# Patient Record
Sex: Female | Born: 1995 | Race: Black or African American | Hispanic: No | Marital: Single | Smoking: Never smoker
Health system: Southern US, Community
[De-identification: ages and names within clinical notes are randomized; demographics above are authoritative.]

## PROBLEM LIST (undated history)

## (undated) ENCOUNTER — Inpatient Hospital Stay (HOSPITAL_COMMUNITY): Payer: Self-pay

## (undated) ENCOUNTER — Inpatient Hospital Stay: Payer: Self-pay

## (undated) ENCOUNTER — Emergency Department (HOSPITAL_COMMUNITY): Disposition: A | Discharge status: Home / Self Care | Payer: Self-pay

## (undated) DIAGNOSIS — Z6841 Body Mass Index (BMI) 40.0 and over, adult: Secondary | ICD-10-CM

## (undated) DIAGNOSIS — O24419 Gestational diabetes mellitus in pregnancy, unspecified control: Secondary | ICD-10-CM

## (undated) DIAGNOSIS — N760 Acute vaginitis: Secondary | ICD-10-CM

## (undated) DIAGNOSIS — A749 Chlamydial infection, unspecified: Secondary | ICD-10-CM

## (undated) DIAGNOSIS — B9689 Other specified bacterial agents as the cause of diseases classified elsewhere: Secondary | ICD-10-CM

## (undated) HISTORY — PX: NO PAST SURGERIES: SHX2092

---
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
## 2014-07-02 ENCOUNTER — Encounter (HOSPITAL_COMMUNITY): Payer: Self-pay | Admitting: *Deleted

## 2015-04-21 ENCOUNTER — Inpatient Hospital Stay (HOSPITAL_COMMUNITY)
Admission: AD | Admit: 2015-04-21 | Discharge: 2015-04-22 | Disposition: A | Discharge status: Home / Self Care | Payer: Medicaid Other | Source: Ambulatory Visit | Attending: Obstetrics and Gynecology | Admitting: Obstetrics and Gynecology

## 2015-04-21 ENCOUNTER — Encounter (HOSPITAL_COMMUNITY): Payer: Self-pay

## 2015-04-21 DIAGNOSIS — Z3A38 38 weeks gestation of pregnancy: Secondary | ICD-10-CM | POA: Diagnosis not present

## 2015-04-21 DIAGNOSIS — N898 Other specified noninflammatory disorders of vagina: Secondary | ICD-10-CM | POA: Diagnosis present

## 2015-04-21 DIAGNOSIS — O322XX0 Maternal care for transverse and oblique lie, not applicable or unspecified: Secondary | ICD-10-CM | POA: Diagnosis not present

## 2015-04-21 LAB — POCT FERN TEST: POCT Fern Test: NEGATIVE

## 2015-04-21 MED ORDER — LACTATED RINGERS IV BOLUS (SEPSIS)
500.0000 mL | Freq: Once | INTRAVENOUS | Status: AC
  Administered 2015-04-22: 1000 mL via INTRAVENOUS

## 2015-04-21 NOTE — MAU Note (Signed)
Pt here with c/o contractions and pressure. Says, "I feel like something is coming out." Denies any problems with the pregnancy. Reports GBS negative. Reports positive fetal movement.

## 2015-04-22 LAB — URINALYSIS, ROUTINE W REFLEX MICROSCOPIC
Bilirubin Urine: NEGATIVE
Glucose, UA: NEGATIVE mg/dL
Hgb urine dipstick: NEGATIVE
Ketones, ur: 15 mg/dL — AB
Nitrite: NEGATIVE
Protein, ur: NEGATIVE mg/dL
Specific Gravity, Urine: 1.025 (ref 1.005–1.030)
Urobilinogen, UA: 1 mg/dL (ref 0.0–1.0)
pH: 6 (ref 5.0–8.0)

## 2015-04-22 LAB — WET PREP, GENITAL
Trich, Wet Prep: NONE SEEN
Yeast Wet Prep HPF POC: NONE SEEN

## 2015-04-22 LAB — URINE MICROSCOPIC-ADD ON

## 2015-04-22 MED ORDER — METRONIDAZOLE 500 MG PO TABS: 500.0000 mg | ORAL_TABLET | Freq: Two times a day (BID) | ORAL | Status: DC

## 2015-04-22 MED ORDER — ACETAMINOPHEN 325 MG PO TABS
650.0000 mg | ORAL_TABLET | Freq: Once | ORAL | Status: AC
  Administered 2015-04-22: 650 mg via ORAL
  Filled 2015-04-22: qty 2

## 2015-04-22 NOTE — Discharge Instructions (Signed)
·   Please keep OB appt with provider. Let them know that your baby is not head down  Treating you for bacterial vaginosis with flagyl for 7 days

## 2015-04-22 NOTE — MAU Note (Signed)
Pt unable to find transportation back home. Taxi called and patient given taxi voucher.

## 2015-04-22 NOTE — MAU Provider Note (Signed)
Patient here as labor check. Called to bedside to evaluate as position unknown and possible ROM. Bedside US showed transverse lie. Negative pooling and negative fern. Vaginal discharge present with odor. Wet prep with signs of BV. Will treat with flagyl.  FHT reassuring and reactive. Toco with contractions q2-7min but not cerival change over 2 hours (CE: 1/thick/-3). Okay to discharge home with return precautions. Not in labor. She follows up with her OB provider on Tuesday.    Caryl Ada, DO 04/22/2015, 1:11 AM PGY-2, [facility]

## 2015-10-28 ENCOUNTER — Encounter (HOSPITAL_BASED_OUTPATIENT_CLINIC_OR_DEPARTMENT_OTHER): Payer: Self-pay | Admitting: *Deleted

## 2015-10-28 ENCOUNTER — Emergency Department (HOSPITAL_BASED_OUTPATIENT_CLINIC_OR_DEPARTMENT_OTHER)
Admission: EM | Admit: 2015-10-28 | Discharge: 2015-10-28 | Disposition: A | Discharge status: Home / Self Care | Payer: Medicaid Other | Attending: Emergency Medicine | Admitting: Emergency Medicine

## 2015-10-28 DIAGNOSIS — Z792 Long term (current) use of antibiotics: Secondary | ICD-10-CM | POA: Insufficient documentation

## 2015-10-28 DIAGNOSIS — Z3202 Encounter for pregnancy test, result negative: Secondary | ICD-10-CM | POA: Insufficient documentation

## 2015-10-28 DIAGNOSIS — B9689 Other specified bacterial agents as the cause of diseases classified elsewhere: Secondary | ICD-10-CM

## 2015-10-28 DIAGNOSIS — N760 Acute vaginitis: Secondary | ICD-10-CM | POA: Diagnosis not present

## 2015-10-28 DIAGNOSIS — R51 Headache: Secondary | ICD-10-CM | POA: Insufficient documentation

## 2015-10-28 DIAGNOSIS — R1030 Lower abdominal pain, unspecified: Secondary | ICD-10-CM | POA: Diagnosis present

## 2015-10-28 LAB — URINALYSIS, ROUTINE W REFLEX MICROSCOPIC
Bilirubin Urine: NEGATIVE
Glucose, UA: NEGATIVE mg/dL
Hgb urine dipstick: NEGATIVE
Ketones, ur: NEGATIVE mg/dL
Nitrite: NEGATIVE
Protein, ur: NEGATIVE mg/dL
Specific Gravity, Urine: 1.027 (ref 1.005–1.030)
pH: 7.5 (ref 5.0–8.0)

## 2015-10-28 LAB — WET PREP, GENITAL
Sperm: NONE SEEN
Trich, Wet Prep: NONE SEEN
Yeast Wet Prep HPF POC: NONE SEEN

## 2015-10-28 LAB — URINE MICROSCOPIC-ADD ON

## 2015-10-28 LAB — PREGNANCY, URINE: Preg Test, Ur: NEGATIVE

## 2015-10-28 MED ORDER — METRONIDAZOLE 500 MG PO TABS: 500.0000 mg | ORAL_TABLET | Freq: Two times a day (BID) | ORAL | Status: DC

## 2015-10-28 NOTE — ED Provider Notes (Signed)
CSN: [id number]     Arrival date & time 10/28/15  4098 History  By signing my name below, I, Bethel Born, attest that this documentation has been prepared under the direction and in the presence of Geoffery Lyons, MD. Electronically Signed: Bethel Born, ED Scribe. 10/28/2015. 8:38 PM   Chief Complaint  Patient presents with  . Abdominal Pain   The history is provided by the patient. No language interpreter was used.   Sheryl Monroe is a 20 y.o. female who presents to the Emergency Department complaining of new, cramping/pressure, 9/10 in severity, lower abdominal pain with onset 1 week ago. She notes 2 episodes of heavy vaginal bleeding this month that lasted for a week each time. Associated symptoms include abnormal vaginal discharge in between episodes of bleeding, increased urgency, headache, and subjective fever last week. She denies dysuria. Pt is sexually active but denies any new partners. She used the Mirena IUD for birth control.    Past Medical History  Diagnosis Date  . Chlamydia   . Bacterial vaginosis    Past Surgical History  Procedure Laterality Date  . No past surgeries     Family History  Problem Relation Age of Onset  . Diabetes Mother    Social History  Substance Use Topics  . Smoking status: Never Smoker   . Smokeless tobacco: None  . Alcohol Use: No   OB History    Gravida Para Term Preterm AB TAB SAB Ectopic Multiple Living   0 0 0 0 0 0 2     Review of Systems  10 Systems reviewed and all are negative for acute change except as noted in the HPI.  Allergies  Review of patient's allergies indicates no known allergies.  Home Medications   Prior to Admission medications   Medication Sig Start Date End Date Taking? Authorizing Provider  levonorgestrel (MIRENA, 52 MG,) 20 MCG/24HR IUD 1 each by Intrauterine route once.   Yes Historical Provider, MD  metroNIDAZOLE (FLAGYL) 500 MG tablet Take 1 tablet (500 mg total) by mouth 2 (two) times  daily. 04/22/15   Pincus Large, DO   BP 148/101 mmHg  Pulse 93  Temp(Src) 98.3 F (36.8 C) (Oral)  Resp 20  Ht  (1.651 m)  Wt 240 lb (108.863 kg)  BMI 39.94 kg/m2  SpO2 100%  LMP  (LMP Unknown)  Breastfeeding? Unknown Physical Exam  Constitutional: She is oriented to person, place, and time. She appears well-developed and well-nourished. No distress.  HENT:  Head: Normocephalic and atraumatic.  Eyes: EOM are normal.  Neck: Normal range of motion.  Cardiovascular: Normal rate, regular rhythm and normal heart sounds.   Pulmonary/Chest: Effort normal and breath sounds normal.  Abdominal: Soft. She exhibits no distension. There is tenderness.  Mild suprapubic tenderness  Musculoskeletal: Normal range of motion.  Neurological: She is alert and oriented to person, place, and time.  Skin: Skin is warm and dry.  Psychiatric: She has a normal mood and affect. Judgment normal.  Nursing note and vitals reviewed.   ED Course  Procedures (including critical care time) DIAGNOSTIC STUDIES: Oxygen Saturation is 100% on RA,  normal by my interpretation.    COORDINATION OF CARE: 8:36 PM Discussed treatment plan which includes lab work and pelvic exam with pt at bedside and pt agreed to plan.  Labs Review Labs Reviewed  URINALYSIS, ROUTINE W REFLEX MICROSCOPIC (NOT AT [facility]) - Abnormal; Notable for the following:    APPearance CLOUDY (*)  Leukocytes, UA LARGE (*)    All other components within normal limits  URINE MICROSCOPIC-ADD ON - Abnormal; Notable for the following:    Squamous Epithelial / LPF 6-30 (*)    Bacteria, UA FEW (*)    All other components within normal limits  PREGNANCY, URINE    Imaging Review No results found. I have personally reviewed and evaluated these lab results as part of my medical decision-making.    MDM   Final diagnoses:  None    Workup reveals leukocytes and her UA, however no other findings consistent with UTI. Her wet prep reveals  clue cells and moderate WBCs. She will be treated with Flagyl for BV awaiting GC and Chlamydia testing.  I personally performed the services described in this documentation, which was scribed in my presence. The recorded information has been reviewed and is accurate.       Geoffery Lyons, MD 10/28/15 2118

## 2015-10-28 NOTE — Discharge Instructions (Signed)
Flagyl as prescribed. ° °We will call you if your cultures indicate you require further treatment. ° ° °Bacterial Vaginosis °Bacterial vaginosis is a vaginal infection that occurs when the normal balance of bacteria in the vagina is disrupted. It results from an overgrowth of certain bacteria. This is the most common vaginal infection in women of childbearing age. Treatment is important to prevent complications, especially in pregnant women, as it can cause a premature delivery. °CAUSES  °Bacterial vaginosis is caused by an increase in harmful bacteria that are normally present in smaller amounts in the vagina. Several different kinds of bacteria can cause bacterial vaginosis. However, the reason that the condition develops is not fully understood. °RISK FACTORS °Certain activities or behaviors can put you at an increased risk of developing bacterial vaginosis, including: °· Having a new sex partner or multiple sex partners. °· Douching. °· Using an intrauterine device (IUD) for contraception. °Women do not get bacterial vaginosis from toilet seats, bedding, swimming pools, or contact with objects around them. °SIGNS AND SYMPTOMS  °Some women with bacterial vaginosis have no signs or symptoms. Common symptoms include: °· Grey vaginal discharge. °· A fishlike odor with discharge, especially after sexual intercourse. °· Itching or burning of the vagina and vulva. °· Burning or pain with urination. °DIAGNOSIS  °Your health care provider will take a medical history and examine the vagina for signs of bacterial vaginosis. A sample of vaginal fluid may be taken. Your health care provider will look at this sample under a microscope to check for bacteria and abnormal cells. A vaginal pH test may also be done.  °TREATMENT  °Bacterial vaginosis may be treated with antibiotic medicines. These may be given in the form of a pill or a vaginal cream. A second round of antibiotics may be prescribed if the condition comes back after  treatment. Because bacterial vaginosis increases your risk for sexually transmitted diseases, getting treated can help reduce your risk for chlamydia, gonorrhea, HIV, and herpes. °HOME CARE INSTRUCTIONS  °· Only take over-the-counter or prescription medicines as directed by your health care provider. °· If antibiotic medicine was prescribed, take it as directed. Make sure you finish it even if you start to feel better. °· Tell all sexual partners that you have a vaginal infection. They should see their health care provider and be treated if they have problems, such as a mild rash or itching. °· During treatment, it is important that you follow these instructions: °¨ Avoid sexual activity or use condoms correctly. °¨ Do not douche. °¨ Avoid alcohol as directed by your health care provider. °¨ Avoid breastfeeding as directed by your health care provider. °SEEK MEDICAL CARE IF:  °· Your symptoms are not improving after 3 days of treatment. °· You have increased discharge or pain. °· You have a fever. °MAKE SURE YOU:  °· Understand these instructions. °· Will watch your condition. °· Will get help right away if you are not doing well or get worse. °FOR MORE INFORMATION  °Centers for Disease Control and Prevention, Division of STD Prevention: www.cdc.gov/std °American Sexual Health Association (ASHA): www.ashastd.org  °  °This information is not intended to replace advice given to you by your health care provider. Make sure you discuss any questions you have with your health care provider. °  °Document Released: 08/17/2005 Document Revised: 09/07/2014 Document Reviewed: 03/29/2013 °Elsevier Interactive Patient Education ©2016 [employer]vier Inc. ° °

## 2015-10-28 NOTE — ED Notes (Signed)
Lower abdominal pain on and off x 1 week. Started after her menses ended.

## 2015-10-30 LAB — GC/CHLAMYDIA PROBE AMP (~~LOC~~) NOT AT ARMC
Chlamydia: POSITIVE — AB
Neisseria Gonorrhea: NEGATIVE

## 2015-10-31 ENCOUNTER — Telehealth (HOSPITAL_COMMUNITY): Payer: Self-pay

## 2015-10-31 NOTE — Telephone Encounter (Signed)
Results received from [facility].  No antibiotic treatment or prescription given for STD.  Chart to MD office for review.  [facility] form attached. 

## 2015-11-01 ENCOUNTER — Telehealth (HOSPITAL_COMMUNITY): Payer: Self-pay

## 2015-11-01 NOTE — Telephone Encounter (Signed)
Chart returned from EDP. Reviewed by Allyne GeeSanders PA . Pt needs Azithromycin 1000mg  po x 1. Attempting to contact. Unable to reach by telephone. Letter sent to address on record.

## 2015-11-17 ENCOUNTER — Telehealth (HOSPITAL_BASED_OUTPATIENT_CLINIC_OR_DEPARTMENT_OTHER): Payer: Self-pay | Admitting: Emergency Medicine

## 2015-11-17 NOTE — Telephone Encounter (Signed)
Lost to followup, letter returned, no forwarding address

## 2017-01-11 ENCOUNTER — Emergency Department (HOSPITAL_BASED_OUTPATIENT_CLINIC_OR_DEPARTMENT_OTHER)
Admission: EM | Admit: 2017-01-11 | Discharge: 2017-01-11 | Disposition: A | Discharge status: Home / Self Care | Payer: 59 | Attending: Emergency Medicine | Admitting: Emergency Medicine

## 2017-01-11 ENCOUNTER — Encounter (HOSPITAL_BASED_OUTPATIENT_CLINIC_OR_DEPARTMENT_OTHER): Payer: Self-pay | Admitting: Emergency Medicine

## 2017-01-11 ENCOUNTER — Emergency Department (HOSPITAL_BASED_OUTPATIENT_CLINIC_OR_DEPARTMENT_OTHER): Payer: 59

## 2017-01-11 DIAGNOSIS — S46912A Strain of unspecified muscle, fascia and tendon at shoulder and upper arm level, left arm, initial encounter: Secondary | ICD-10-CM | POA: Diagnosis not present

## 2017-01-11 DIAGNOSIS — Y929 Unspecified place or not applicable: Secondary | ICD-10-CM | POA: Diagnosis not present

## 2017-01-11 DIAGNOSIS — Y9389 Activity, other specified: Secondary | ICD-10-CM | POA: Diagnosis not present

## 2017-01-11 DIAGNOSIS — Y990 Civilian activity done for income or pay: Secondary | ICD-10-CM | POA: Diagnosis not present

## 2017-01-11 DIAGNOSIS — S4992XA Unspecified injury of left shoulder and upper arm, initial encounter: Secondary | ICD-10-CM | POA: Diagnosis present

## 2017-01-11 DIAGNOSIS — X58XXXA Exposure to other specified factors, initial encounter: Secondary | ICD-10-CM | POA: Diagnosis not present

## 2017-01-11 LAB — PREGNANCY, URINE: Preg Test, Ur: NEGATIVE

## 2017-01-11 MED ORDER — NAPROXEN 500 MG PO TABS: 500.0000 mg | ORAL_TABLET | Freq: Two times a day (BID) | ORAL | 0 refills | Status: DC

## 2017-01-11 MED ORDER — TRAMADOL HCL 50 MG PO TABS
50.0000 mg | ORAL_TABLET | Freq: Four times a day (QID) | ORAL | 0 refills | Status: DC | PRN
Start: 2017-01-11 — End: 2018-08-11

## 2017-01-11 NOTE — ED Provider Notes (Signed)
[facility]   CSN: [id number] Arrival date & time: 01/11/17  [id number]     History   Chief Complaint Chief Complaint  Patient presents with  . Shoulder Pain    HPI Sheryl Monroe is a 21 y.o. female.  Patient is a 21 year old female with no significant past medical history. She presents for evaluation of left shoulder pain. 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 She felt a pull in her shoulder and it has been hurting since. It starts in the back of her shoulder and radiates down her hand. She reports numbness in her left fingertips. Her pain is worse with movement and relieved with rest. She has been wearing a sling the block to her mother from a prior injury that her mother had.   The history is provided by the patient.  Shoulder Pain   This is a new problem. The current episode started 2 days ago. The problem occurs constantly. The problem has not changed since onset.The pain is present in the left shoulder. The pain is moderate. Associated symptoms include numbness. The symptoms are aggravated by activity. She has tried OTC pain medications for the symptoms. The treatment provided no relief.    Past Medical History:  Diagnosis Date  . Bacterial vaginosis   . Chlamydia     Patient Active Problem List   Diagnosis Date Noted  . Social isolation 09/01/2013  . Active labor 08/31/2013    Past Surgical History:  Procedure Laterality Date  . NO PAST SURGERIES      OB History    Gravida Para Term Preterm AB Living   3 2 2  0 0 2   SAB TAB Ectopic Multiple Live Births   0 0 0 0 2       Home Medications    Prior to Admission medications   Medication Sig Start Date End Date Taking? Authorizing Provider  levonorgestrel (MIRENA, 52 MG,) 20 MCG/24HR IUD 1 each by Intrauterine route once.    [provider]  metroNIDAZOLE (FLAGYL) 500 MG tablet Take 1 tablet (500 mg total) by mouth 2 (two) times  daily. One po bid x 7 days 10/28/15   Geoffery Lyonselo, Eulas Schweitzer, MD    Family History Family History  Problem Relation Age of Onset  . Diabetes Mother     Social History Social History  Substance Use Topics  . Smoking status: Never Smoker  . Smokeless tobacco: Never Used  . Alcohol use No     Allergies   Patient has no known allergies.   Review of Systems Review of Systems  Neurological: Positive for numbness.  All other systems reviewed and are negative.    Physical Exam Updated Vital Signs BP (!) 146/97 (BP Location: Right Arm)   Pulse 89   Temp 98.7 F (37.1 C) (Oral)   Resp 18   Ht 5\' 5"  (1.651 m)   Wt 250 lb (113.4 kg)   SpO2 100%   BMI 41.60 kg/m   Physical Exam  Constitutional: She is oriented to person, place, and time. She appears well-developed and well-nourished. No distress.  HENT:  Head: Normocephalic and atraumatic.  Neck: Normal range of motion. Neck supple.  Musculoskeletal:  The left shoulder appears grossly normal. There is tenderness to palpation in the soft tissues of the posterior aspect of the shoulder and overlying the scapula. The left hand appears grossly normal. Ulnar and radial pulses are easily palpable.  Strength is 5 out of 5 throughout the entire hand and she is able to flex and extend all fingers. She does report some numbness to sensation in the lateral aspect of her left finger.  Neurological: She is alert and oriented to person, place, and time.  Skin: Skin is warm and dry. She is not diaphoretic.  Nursing note and vitals reviewed.    ED Treatments / Results  Labs (all labs ordered are listed, but only abnormal results are displayed) Labs Reviewed  PREGNANCY, URINE    EKG  EKG Interpretation None       Radiology No results found.  Procedures Procedures (including critical care time)  Medications Ordered in ED Medications - No data to display   Initial Impression / Assessment and Plan / ED Course  I have reviewed the  triage vital signs and the nursing notes.  Pertinent labs & imaging results that were available during my care of the patient were reviewed by me and considered in my medical decision making (see chart for details).  X-rays are negative. This appears to be a rotator cuff strain. This will be treated with anti-inflammatories continued use of her sling, and follow-up in the next week if not improving to discuss imaging versus physical therapy.  Final Clinical Impressions(s) / ED Diagnoses   Final diagnoses:  None    New Prescriptions New Prescriptions   No medications on file     Geoffery Lyons, MD 01/11/17 1013

## 2017-01-11 NOTE — Discharge Instructions (Signed)
Naproxen as prescribed.  Tramadol as prescribed as needed for pain not relieved with naproxen.  Continue to wear your shoulder sling for comfort.  Follow-up with your primary Dr. if you're not improving in the next week to discuss further imaging or physical therapy.

## 2017-01-11 NOTE — ED Triage Notes (Signed)
Patient states that she has pain to her left side, back and shoulder down into her left hand. The patient has a sling on

## 2018-05-10 ENCOUNTER — Emergency Department: Payer: Self-pay

## 2018-05-10 ENCOUNTER — Encounter: Payer: Self-pay | Admitting: Emergency Medicine

## 2018-05-10 DIAGNOSIS — R05 Cough: Secondary | ICD-10-CM | POA: Insufficient documentation

## 2018-05-10 DIAGNOSIS — Z5321 Procedure and treatment not carried out due to patient leaving prior to being seen by health care provider: Secondary | ICD-10-CM | POA: Insufficient documentation

## 2018-05-10 NOTE — ED Triage Notes (Signed)
Pt c/o cough, nasal and chest congestion as well as generalized fatigue x4 days.

## 2018-05-11 ENCOUNTER — Emergency Department
Admission: EM | Admit: 2018-05-11 | Discharge: 2018-05-11 | Discharge status: Against Medical Advice | Payer: Self-pay | Attending: Emergency Medicine | Admitting: Emergency Medicine

## 2018-08-11 ENCOUNTER — Observation Stay
Admission: EM | Admit: 2018-08-11 | Discharge: 2018-08-11 | Disposition: A | Discharge status: Home / Self Care | Payer: Medicaid Other | Attending: Obstetrics and Gynecology | Admitting: Obstetrics and Gynecology

## 2018-08-11 ENCOUNTER — Other Ambulatory Visit: Payer: Self-pay

## 2018-08-11 ENCOUNTER — Observation Stay: Payer: Medicaid Other

## 2018-08-11 DIAGNOSIS — O99013 Anemia complicating pregnancy, third trimester: Secondary | ICD-10-CM | POA: Diagnosis not present

## 2018-08-11 DIAGNOSIS — O4693 Antepartum hemorrhage, unspecified, third trimester: Secondary | ICD-10-CM | POA: Diagnosis not present

## 2018-08-11 DIAGNOSIS — O4692 Antepartum hemorrhage, unspecified, second trimester: Secondary | ICD-10-CM | POA: Diagnosis present

## 2018-08-11 DIAGNOSIS — Z3A34 34 weeks gestation of pregnancy: Secondary | ICD-10-CM | POA: Diagnosis not present

## 2018-08-11 LAB — WET PREP, GENITAL
Clue Cells Wet Prep HPF POC: NONE SEEN
Sperm: NONE SEEN
Trich, Wet Prep: NONE SEEN
Yeast Wet Prep HPF POC: NONE SEEN

## 2018-08-11 LAB — KLEIHAUER-BETKE STAIN
Fetal Cells %: 5 %
Quantitation Fetal Hemoglobin: 0.0023 mL

## 2018-08-11 LAB — CBC
HCT: 28.8 % — ABNORMAL LOW (ref 36.0–46.0)
Hemoglobin: 8.7 g/dL — ABNORMAL LOW (ref 12.0–15.0)
MCH: 25.6 pg — ABNORMAL LOW (ref 26.0–34.0)
MCHC: 30.2 g/dL (ref 30.0–36.0)
MCV: 84.7 fL (ref 80.0–100.0)
Platelets: 283 10*3/uL (ref 150–400)
RBC: 3.4 MIL/uL — ABNORMAL LOW (ref 3.87–5.11)
RDW: 17.5 % — ABNORMAL HIGH (ref 11.5–15.5)
WBC: 7.6 10*3/uL (ref 4.0–10.5)
nRBC: 0 % (ref 0.0–0.2)

## 2018-08-11 LAB — CHLAMYDIA/NGC RT PCR (ARMC ONLY)
Chlamydia Tr: NOT DETECTED
N gonorrhoeae: NOT DETECTED

## 2018-08-11 LAB — TYPE AND SCREEN
ABO/RH(D): O POS
Antibody Screen: NEGATIVE

## 2018-08-11 LAB — OB RESULTS CONSOLE GC/CHLAMYDIA
Chlamydia: NEGATIVE
Gonorrhea: NEGATIVE

## 2018-08-11 MED ORDER — ACETAMINOPHEN 325 MG PO TABS
650.0000 mg | ORAL_TABLET | ORAL | Status: DC | PRN
  Administered 2018-08-11: 650 mg via ORAL
  Filled 2018-08-11: qty 2

## 2018-08-11 NOTE — OB Triage Note (Addendum)
Pt G5P4 presents today with complaints of bleeding and abdominal pain. [facility] to the [facility]. Pt reports she is [redacted] weeks pregnant based off of her LMP however states she 'still has her period every month that last for several days". Her bleeding last started Sunday and ended Tuesday. No bleeding since. Abdominal pain comes and goes for the last month and she rates it a 7/10 and describes it as cramping and pressure. Pt also reports having recent headaches along with N/V. Pt reports +FM and reports no vaginal discharge. Monitors applied. VSS.

## 2018-08-11 NOTE — Discharge Summary (Signed)
Physician Discharge Summary  Patient ID: Sheryl Monroe MRN: [id number] DOB/AGE: 10/02/1995 22 y.o.  Admit date: 08/11/2018 Discharge date: 08/11/2018  Admission Diagnoses: Second trimester vaginal bleeding  Discharge Diagnoses:  Active Problems:   Antepartum bleeding, second trimester   Discharged Condition: stable  Hospital Course: 22 year old G5P4004 at unknown gestational age, but roughly late second early third trimester presenting from health department for evaluation of continued monthly bleeding throughout pregnancy.  On admission fetal heart tones were easily traced with a reactive strip noted.  No current vaginal bleeding.  Ultrasound was obtained to rule out potential for placenta previa and showed the placenta to be well away from the cervical os with a cervical length of 4.4cm and biometrics consistent with [redacted]w[redacted]d giving an EDD of 09/20/18.  CBC did reveal anemia.  Kleihauer-bethke to rule out abruption pending.  GC/CT cultures were ordered but the patient left AMA prior to full evaluation secondary to child care issues.  Consults: None  Significant Diagnostic Studies:  Results for orders placed or performed during the hospital encounter of 08/11/18 (from the past 24 hour(s))  CBC     Status: Abnormal   Collection Time: 08/11/18  2:26 PM  Result Value Ref Range   WBC 7.6 4.0 - 10.5 K/uL   RBC 3.40 (L) 3.87 - 5.11 MIL/uL   Hemoglobin 8.7 (L) 12.0 - 15.0 g/dL   HCT 78.2 (L) 95.6 - 21.3 %   MCV 84.7 80.0 - 100.0 fL   MCH 25.6 (L) 26.0 - 34.0 pg   MCHC 30.2 30.0 - 36.0 g/dL   RDW 08.6 (H) 57.8 - 46.9 %   Platelets 283 150 - 400 K/uL   nRBC 0.0 0.0 - 0.2 %  Type and screen     Status: None   Collection Time: 08/11/18  2:26 PM  Result Value Ref Range   ABO/RH(D) O POS    Antibody Screen NEG    Sample Expiration      08/14/2018 Performed at [facility], [address] [place], [place] [address]   Wet prep, genital     Status: Abnormal   Collection Time:  08/11/18  4:47 PM  Result Value Ref Range   Yeast Wet Prep HPF POC NONE SEEN NONE SEEN   Trich, Wet Prep NONE SEEN NONE SEEN   Clue Cells Wet Prep HPF POC NONE SEEN NONE SEEN   WBC, Wet Prep HPF POC FEW (A) NONE SEEN   Sperm NONE SEEN    US Ob Comp + 14 Wk  Result Date: 08/11/2018 CLINICAL DATA:  Second trimester bleeding. EXAM: OBSTETRICAL ULTRASOUND >14 WKS FINDINGS: Number of Fetuses: 1 Heart Rate:  175 bpm Movement: Yes Presentation: Cephalic Previa: No Placental Location: Posterior fundal Amniotic Fluid (Subjective): Normal Amniotic Fluid (Objective): AFI = 12 cm (5%ile= 8.1 cm, 95%= 24.8 cm for 34 wks) FETAL BIOMETRY BPD: 8.26cm 33w 2d HC:   30.8cm 34w 3d AC:   31.2cm 35w 1d FL:   6.76cm 34w 5d Current Mean GA: 34w 2d [place] EDC: 09/20/2018 Estimated Fetal Weight:  2509g    59%ile FETAL ANATOMY Lateral Ventricles: Appears normal Thalami/CSP: Appears normal Posterior Fossa:  Not visualized Nuchal Region: Not visualized   NFT= N/A > 20 WKS Upper Lip: Appears normal Spine: Limited views appear normal 4 Chamber Heart on Left: Limited views appear normal LVOT: Appears normal RVOT: Not visualized Stomach on Left: Appears normal 3 Vessel Cord: Appears normal Cord Insertion site: Not visualized Kidneys: Appears normal Bladder: Appears  normal Extremities: Appears normal Sex: Female Technically difficult due to: Limited examination secondary to body habitus and advanced gestational age. Maternal Findings: Cervix:  Normal cervical length measuring 4.4 cm.  Cervix is closed. IMPRESSION: 1. Single live intrauterine pregnancy as detailed above. Electronically Signed   By: Elige KoHetal  Patel   On: 08/11/2018 16:37     Discharge Exam: Blood pressure 133/75, pulse (!) 107, temperature 97.9 F (36.6 C), temperature source Oral, resp. rate 20, height 5\' 5"  (1.651 m), weight 113.4 kg, unknown if currently breastfeeding.   Disposition: Discharge disposition: 01-Home or Self Care       Discharge Instructions     Discharge activity:  No Restrictions   Complete by:  As directed    Discharge diet:  No restrictions   Complete by:  As directed    Fetal Kick Count:  Lie on our left side for one hour after a meal, and count the number of times your baby kicks.  If it is less than 5 times, get up, move around and drink some juice.  Repeat the test 30 minutes later.  If it is still less than 5 kicks in an hour, notify your doctor.   Complete by:  As directed    LABOR:  When conractions begin, you should start to time them from the beginning of one contraction to the beginning  of the next.  When contractions are 5 - 10 minutes apart or less and have been regular for at least an hour, you should call your health care provider.   Complete by:  As directed    No sexual activity restrictions   Complete by:  As directed    Notify physician for bleeding from the vagina   Complete by:  As directed    Notify physician for blurring of vision or spots before the eyes   Complete by:  As directed    Notify physician for chills or fever   Complete by:  As directed    Notify physician for fainting spells, "black outs" or loss of consciousness   Complete by:  As directed    Notify physician for increase in vaginal discharge   Complete by:  As directed    Notify physician for leaking of fluid   Complete by:  As directed    Notify physician for pain or burning when urinating   Complete by:  As directed    Notify physician for pelvic pressure (sudden increase)   Complete by:  As directed    Notify physician for severe or continued nausea or vomiting   Complete by:  As directed    Notify physician for sudden gushing of fluid from the vagina (with or without continued leaking)   Complete by:  As directed    Notify physician for sudden, constant, or occasional abdominal pain   Complete by:  As directed    Notify physician if baby moving less than usual   Complete by:  As directed      Allergies as of 08/11/2018   No  Known Allergies     Medication List    STOP taking these medications   metroNIDAZOLE 500 MG tablet Commonly known as:  FLAGYL   MIRENA (52 MG) 20 MCG/24HR IUD Generic drug:  levonorgestrel   naproxen 500 MG tablet Commonly known as:  NAPROSYN   traMADol 50 MG tablet Commonly known as:  ULTRAM        Signed: Vena Austriandreas Shernell Saldierna 08/11/2018, 5:49 PM

## 2018-08-11 NOTE — Progress Notes (Signed)
Pt notified this RN at 1630 that she must leave hospital by 1700 to pick up her kids. RN notified MD and states he needs to do a wet prep and a G/c urine needs to be sent down so if pt needs to leave it will have to be AMA. RN discussed with pt and pt understands but she must pick up her kids so she states she will leave AMA. [facility] if pt can do swabs and MD agrees. swabs sent to lab prior to pt leaving. RN and [facility] educated pt on leaving AMA. Pt verbalizes understanding.

## 2018-08-15 ENCOUNTER — Encounter: Payer: Self-pay | Admitting: Obstetrics & Gynecology

## 2018-09-01 ENCOUNTER — Other Ambulatory Visit: Payer: Self-pay | Admitting: Physician Assistant

## 2018-09-01 DIAGNOSIS — Z3483 Encounter for supervision of other normal pregnancy, third trimester: Secondary | ICD-10-CM

## 2018-09-01 DIAGNOSIS — Z3689 Encounter for other specified antenatal screening: Secondary | ICD-10-CM

## 2018-09-02 ENCOUNTER — Other Ambulatory Visit: Payer: Self-pay | Admitting: Physician Assistant

## 2018-09-02 ENCOUNTER — Ambulatory Visit
Admission: RE | Admit: 2018-09-02 | Discharge: 2018-09-02 | Disposition: A | Discharge status: Home / Self Care | Payer: Medicaid Other | Source: Ambulatory Visit | Attending: Physician Assistant | Admitting: Physician Assistant

## 2018-09-02 DIAGNOSIS — Z3689 Encounter for other specified antenatal screening: Secondary | ICD-10-CM | POA: Insufficient documentation

## 2018-09-02 DIAGNOSIS — Z3483 Encounter for supervision of other normal pregnancy, third trimester: Secondary | ICD-10-CM | POA: Diagnosis present

## 2018-09-02 LAB — OB RESULTS CONSOLE HEPATITIS B SURFACE ANTIGEN: Hepatitis B Surface Ag: NEGATIVE

## 2018-09-02 LAB — OB RESULTS CONSOLE RPR: RPR: NONREACTIVE

## 2018-09-03 LAB — OB RESULTS CONSOLE GBS: GBS: POSITIVE

## 2018-09-05 ENCOUNTER — Encounter
Admission: RE | Admit: 2018-09-05 | Discharge: 2018-09-05 | Disposition: A | Discharge status: Home / Self Care | Payer: Self-pay | Source: Ambulatory Visit | Attending: Anesthesiology | Admitting: Anesthesiology

## 2018-09-05 NOTE — Consult Note (Signed)
[facility] Anesthesia Consultation  JALEE MCCLOY BTD:[id number] DOB: [birth year] DOA: 09/05/2018 PCP: Patient, No Pcp Per   Requesting physician: [facility] Date of consultation: 09/05/18 Reason for consultation: Obesity during pregnancy  CHIEF COMPLAINT:  Obesity during pregnancy  HISTORY OF PRESENT ILLNESS: Sheryl Monroe  is a 23 y.o. female with a known history of obesity during pregnancy, late entry to prenatal care. Denies hx of cardiac disease, denies asthma. Denies personal or family hx of bleeding disorder. Has had 4 prior vaginal deliveries, has never had an epidural, used systemic pain medication for pain management for last deliveries, does not desire epidural for labor pain management.    PAST MEDICAL HISTORY:   Past Medical History:  Diagnosis Date  . Bacterial vaginosis   . Chlamydia     PAST SURGICAL HISTORY:  Past Surgical History:  Procedure Laterality Date  . NO PAST SURGERIES      SOCIAL HISTORY:  Social History   Tobacco Use  . Smoking status: Never Smoker  . Smokeless tobacco: Never Used  Substance Use Topics  . Alcohol use: No    FAMILY HISTORY:  Family History  Problem Relation Age of Onset  . Diabetes Mother     DRUG ALLERGIES: No Known Allergies  REVIEW OF SYSTEMS:   RESPIRATORY: No cough, shortness of breath, wheezing.  CARDIOVASCULAR: No chest pain, orthopnea, edema.  HEMATOLOGY: No anemia, easy bruising or bleeding SKIN: No rash or lesion. NEUROLOGIC: No tingling, numbness, weakness.  PSYCHIATRY: No anxiety or depression.   MEDICATIONS AT HOME:  Prior to Admission medications   Not on File      PHYSICAL EXAMINATION:   VITAL SIGNS: Blood pressure (!) 144/86, pulse 100, temperature 36.9 C, temperature source Oral, height 5\' 5"  (1.651 m), weight (!) 151.9 kg, SpO2 99 %, unknown if currently breastfeeding.  GENERAL:  23 y.o.-year-old patient no acute distress.  HEENT: Head  atraumatic, normocephalic. Oropharynx and nasopharynx clear. MP 2, TM distance >3 cm, normal mouth opening, poor dentition. LUNGS: Normal breath sounds bilaterally, no wheezing, rales,rhonchi. No use of accessory muscles of respiration.  CARDIOVASCULAR: S1, S2 normal. No murmurs, rubs, or gallops.  EXTREMITIES: No pedal edema, cyanosis, or clubbing.  NEUROLOGIC: normal gait PSYCHIATRIC: The patient is alert and oriented x 3.  SKIN: No obvious rash, lesion, or ulcer.    IMPRESSION AND PLAN:   [name]  is a 23 y.o. female presenting with obesity during pregnancy. BMI is currently 45 at [redacted] weeks gestation. Planning for vaginal delivery.  Pt required prompting for good airway exam, complaining of tooth pain on R side, after prompting airway exam is reassuring. Excess back adiposity but midline is easily identified.  Discussed spinal vs GA if cesarean delivery is required. Discussed increased risk of difficult intubation during pregnancy should an emergency cesarean delivery be required.   Pt had induction with her last 2 deliveries, she is unsure if they are planning to induce her this pregnancy.  Plan for delivery at [facility].

## 2018-09-06 ENCOUNTER — Observation Stay
Admission: EM | Admit: 2018-09-06 | Discharge: 2018-09-07 | Disposition: A | Discharge status: Home / Self Care | Payer: Medicaid Other | Attending: Obstetrics and Gynecology | Admitting: Obstetrics and Gynecology

## 2018-09-06 DIAGNOSIS — N760 Acute vaginitis: Secondary | ICD-10-CM | POA: Insufficient documentation

## 2018-09-06 DIAGNOSIS — R51 Headache: Secondary | ICD-10-CM | POA: Insufficient documentation

## 2018-09-06 DIAGNOSIS — O26893 Other specified pregnancy related conditions, third trimester: Principal | ICD-10-CM | POA: Insufficient documentation

## 2018-09-06 DIAGNOSIS — R109 Unspecified abdominal pain: Secondary | ICD-10-CM | POA: Insufficient documentation

## 2018-09-06 DIAGNOSIS — Z3A38 38 weeks gestation of pregnancy: Secondary | ICD-10-CM

## 2018-09-06 NOTE — OB Triage Note (Signed)
Recvd pt from EMS. Pt c/o contractions that started at 2200 and started feeling short of breath from how baby is laying. States no vaginal bleeding and felt some watery discharge. Pain is a 7 out of 10.

## 2018-09-07 ENCOUNTER — Inpatient Hospital Stay: Admission: RE | Admit: 2018-09-07 | Payer: Self-pay | Source: Ambulatory Visit

## 2018-09-07 ENCOUNTER — Telehealth: Payer: Self-pay | Admitting: Obstetrics & Gynecology

## 2018-09-07 ENCOUNTER — Encounter: Payer: Self-pay | Admitting: *Deleted

## 2018-09-07 DIAGNOSIS — N760 Acute vaginitis: Secondary | ICD-10-CM | POA: Diagnosis not present

## 2018-09-07 DIAGNOSIS — N898 Other specified noninflammatory disorders of vagina: Secondary | ICD-10-CM | POA: Diagnosis present

## 2018-09-07 DIAGNOSIS — R109 Unspecified abdominal pain: Secondary | ICD-10-CM | POA: Diagnosis present

## 2018-09-07 DIAGNOSIS — O26893 Other specified pregnancy related conditions, third trimester: Secondary | ICD-10-CM | POA: Diagnosis present

## 2018-09-07 DIAGNOSIS — R51 Headache: Secondary | ICD-10-CM | POA: Diagnosis not present

## 2018-09-07 DIAGNOSIS — Z3A38 38 weeks gestation of pregnancy: Secondary | ICD-10-CM | POA: Diagnosis not present

## 2018-09-07 DIAGNOSIS — R1013 Epigastric pain: Secondary | ICD-10-CM

## 2018-09-07 LAB — RUPTURE OF MEMBRANE (ROM)PLUS: Rom Plus: NEGATIVE

## 2018-09-07 LAB — WET PREP, GENITAL
Clue Cells Wet Prep HPF POC: NONE SEEN
Sperm: NONE SEEN
Trich, Wet Prep: NONE SEEN
Yeast Wet Prep HPF POC: NONE SEEN

## 2018-09-07 MED ORDER — METRONIDAZOLE 500 MG PO TABS: 500.0000 mg | ORAL_TABLET | Freq: Two times a day (BID) | ORAL | 0 refills | Status: AC

## 2018-09-07 MED ORDER — BUTALBITAL-APAP-CAFFEINE 50-325-40 MG PO TABS
1.0000 | ORAL_TABLET | Freq: Once | ORAL | Status: AC
  Administered 2018-09-07: 1 via ORAL
  Filled 2018-09-07: qty 1

## 2018-09-07 NOTE — Telephone Encounter (Signed)
Patient was seen in ER Yesterday . Patient is calling for Follow up and refuses to see CS when I tried to schedule her for tomorrow. I schedule patient with you on 09/09/17 at 11:20.

## 2018-09-07 NOTE — Final Progress Note (Signed)
Triage Note  Sheryl Monroe is an 23 y.o. female.  HPI: Patient presents to labor and delivery today brought by EMS. She reports that she has a severe pain in her mid upper gastric area that caused her to vomit. She then had a large gush of fluid which may of been discharge or urine. She called EMS. She has been in a lot of pain. She feels like this pregnancy is bigger than her previous pregnancies. Her largest infant was 8lbs 3oz. She reports that she ahs difficulty  She says that her complications this pregnancy have been bleeding like a period throughout her pregnancy and a short pregnancy interval, and obesity. She reports that she has a headache today and that tylenol does not usually help her headache. She denies gestational diabetes, hypertension, and preeclampsia.   OB History  Gravida Para Term Preterm AB Living  5 4 4  0 0 4  SAB TAB Ectopic Multiple Live Births  0 0 0 0 4    # Outcome Date GA Lbr Len/2nd Weight Sex Delivery Anes PTL Lv  5 Current           4 Term 08/31/13 [redacted]w[redacted]d  3255 g M Vag-Spont None  LIV  3 Term 03/24/12 [redacted]w[redacted]d  3545 g M Vag-Spont None  LIV  2 Term           1 Term             Past Medical History:  Diagnosis Date  . Bacterial vaginosis   . Chlamydia     Past Surgical History:  Procedure Laterality Date  . NO PAST SURGERIES      Family History  Problem Relation Age of Onset  . Diabetes Mother     Social History:  reports that she has never smoked. She has never used smokeless tobacco. She reports that she does not drink alcohol or use drugs.  Allergies: No Known Allergies  Medications: I have reviewed the patient's current medications.  No results found for this or any previous visit (from the past 48 hour(s)).  No results found.  Review of Systems  Constitutional: Negative for chills, fever, malaise/fatigue and weight loss.  HENT: Negative for congestion, hearing loss and sinus pain.   Eyes: Negative for blurred vision and double vision.   Respiratory: Negative for cough, sputum production, shortness of breath and wheezing.   Cardiovascular: Negative for chest pain, palpitations, orthopnea and leg swelling.  Gastrointestinal: Positive for abdominal pain and vomiting. Negative for constipation, diarrhea and nausea.  Genitourinary: Negative for dysuria, flank pain, frequency, hematuria and urgency.  Musculoskeletal: Positive for back pain and joint pain. Negative for falls.  Skin: Negative for itching and rash.  Neurological: Positive for headaches. Negative for dizziness.  Psychiatric/Behavioral: Negative for depression, substance abuse and suicidal ideas. The patient is not nervous/anxious.    Blood pressure 126/64, pulse (!) 110, temperature 98 F (36.7 C), temperature source Oral, unknown if currently breastfeeding. Physical Exam  Nursing note and vitals reviewed. Constitutional: She is oriented to person, place, and time. She appears well-developed and well-nourished.  HENT:  Head: Normocephalic and atraumatic.  Cardiovascular: Normal rate and regular rhythm.  Respiratory: Effort normal and breath sounds normal.  GI: Soft. Bowel sounds are normal.  Genitourinary:    Vaginal discharge present.   Musculoskeletal: Normal range of motion.  Neurological: She is alert and oriented to person, place, and time.  Skin: Skin is warm and dry.  Psychiatric: She has a normal mood and  affect. Her behavior is normal. Judgment and thought content normal.   SVE by nursing : closed/ long/ high  NST: 130 bpm baseline, moderate variability, 15x15 accelerations accelerations, no decelerations. Tocometer :  irregular  Assessment/Plan: 23 yo [id number] 1. Headache- given fioricet for headache her in the hopsital, normal BP. 2. Discomfort related to advanced gestational age- comfort measures recommended. 3. No signs of rupture of membranes 4. Reassuring fetal status 5. Bacterial vaginosis ( +whiff)- discharged with RX for  flagyl  Discharged home Follow up as planned with health department  Marijose Curington R Nashaun Hillmer 09/07/2018, 1:50 AM

## 2018-09-07 NOTE — Telephone Encounter (Signed)
I advised patient per [facility]

## 2018-09-07 NOTE — Discharge Summary (Signed)
See final progress note. 

## 2018-09-07 NOTE — Discharge Instructions (Signed)

## 2018-09-07 NOTE — Telephone Encounter (Signed)
Does not need appt here, just at [facility]

## 2018-09-08 ENCOUNTER — Encounter: Payer: Self-pay | Admitting: Obstetrics and Gynecology

## 2018-09-09 ENCOUNTER — Encounter: Payer: Self-pay | Admitting: Obstetrics & Gynecology

## 2018-09-09 ENCOUNTER — Ambulatory Visit: Payer: Self-pay

## 2018-09-12 ENCOUNTER — Ambulatory Visit: Payer: Medicaid Other

## 2018-09-12 ENCOUNTER — Inpatient Hospital Stay: Payer: Self-pay | Attending: Oncology | Admitting: Oncology

## 2018-09-15 ENCOUNTER — Observation Stay
Admission: EM | Admit: 2018-09-15 | Discharge: 2018-09-15 | Disposition: A | Discharge status: Home / Self Care | Payer: Medicaid Other | Attending: Obstetrics and Gynecology | Admitting: Obstetrics and Gynecology

## 2018-09-15 ENCOUNTER — Other Ambulatory Visit: Payer: Self-pay

## 2018-09-15 DIAGNOSIS — Z3A39 39 weeks gestation of pregnancy: Secondary | ICD-10-CM

## 2018-09-15 DIAGNOSIS — O26893 Other specified pregnancy related conditions, third trimester: Secondary | ICD-10-CM | POA: Diagnosis not present

## 2018-09-15 DIAGNOSIS — R102 Pelvic and perineal pain: Secondary | ICD-10-CM | POA: Insufficient documentation

## 2018-09-15 DIAGNOSIS — O471 False labor at or after 37 completed weeks of gestation: Secondary | ICD-10-CM

## 2018-09-15 NOTE — Discharge Instructions (Signed)
Return to hospital for increase in painful contractions, leaking of fluid, vaginal bleeding, or decreased fetal movement. Keep your regularly scheduled appointments for tomorrow: EKG scheduled for 1pm, NST in labor and delivery at 2pm. Call Birthplace at [facility] at [phone] at 7am on Saturday September 17, 2018 to confirm space available for induction of labor.  Induction of labor scheduled for 8am on Saturday September 17, 2018. Come in through the emergency department to be registered. Be sure to eat prior to coming to hospital.

## 2018-09-15 NOTE — OB Triage Note (Signed)
Pt arrival to triage with c/o contractions for the last half hour.  Pt states vaginal pain for the last 3 days.  Pt states mucousy/bloody dischage throughout the day.  Pt feels baby move normally.  Denies LOF.  EFM and toco applied and assessing.

## 2018-09-15 NOTE — OB Triage Note (Signed)
Discharge instructions provided and reviewed.  Follow up plan of care reviewed.  Pt verbalized understanding.

## 2018-09-15 NOTE — Discharge Summary (Signed)
Physician Discharge Summary   Patient ID: Sheryl Monroe [id number] 23 y.o. Apr 03, 1996  Admit date: 09/15/2018  Discharge date and time: No discharge date for patient encounter.   Admitting Physician: Adelene Idler, MD   Discharge Physician: Adelene Idler MD  Admission Diagnoses: [redacted] wks pregnant  Discharge Diagnoses: [redacted] wks pregnant, maernal discomfort  Admission Condition: good  Discharged Condition: good  Indication for Admission: Maternal discomfort, labor evaluation  Hospital Course: Patient was admitted to triage to rule out labor. She was having no contractions. Her cervix was unchanged from previous examinations. Denies leakage of fluid, denies vaginal bleeding. Reports good fetal movement. She has difficulty with child care and would like to be induced. She was induced with all of her other children. She does not have an induction planned. Planned induction for the 18th. Will have patient return at 8 am. Discharged home in stable condition.   NST: 120 bpm baseline, moderate variability, 15x15 accelerations, no  decelerations. Tocometer : quiet   Consults: None  Significant Diagnostic Studies: none  Treatments: none  Discharge Exam: BP 122/71 (BP Location: Left Arm)   Pulse (!) 111   Temp 97.9 F (36.6 C) (Oral)   Resp 18   Ht 5\' 5"  (1.651 m)   Wt (!) 144.7 kg   BMI 53.08 kg/m   General Appearance:    Alert, cooperative, no distress, appears stated age  Head:    Normocephalic, without obvious abnormality, atraumatic  Eyes:    PERRL, conjunctiva/corneas clear, EOM's intact, fundi    benign, both eyes  Ears:    Normal TM's and external ear canals, both ears  Nose:   Nares normal, septum midline, mucosa normal, no drainage    or sinus tenderness  Throat:   Lips, mucosa, and tongue normal; teeth and gums normal  Neck:   Supple, symmetrical, trachea midline, no adenopathy;    thyroid:  no enlargement/tenderness/nodules; no carotid   bruit or JVD   Back:     Symmetric, no curvature, ROM normal, no CVA tenderness  Lungs:     Clear to auscultation bilaterally, respirations unlabored  Chest Wall:    No tenderness or deformity   Heart:    Regular rate and rhythm, S1 and S2 normal, no murmur, rub   or gallop  Breast Exam:    No tenderness, masses, or nipple abnormality  Abdomen:     Soft, non-tender, bowel sounds active all four quadrants,    no masses, no organomegaly  Genitalia:    Normal female without lesion, discharge or tenderness  Rectal:    Normal tone, normal prostate, no masses or tenderness;   guaiac negative stool  Extremities:   Extremities normal, atraumatic, no cyanosis or edema  Pulses:   2+ and symmetric all extremities  Skin:   Skin color, texture, turgor normal, no rashes or lesions  Lymph nodes:   Cervical, supraclavicular, and axillary nodes normal  Neurologic:   CNII-XII intact, normal strength, sensation and reflexes    throughout    Disposition: Discharge disposition: 01-Home or Self Care       Patient Instructions:  Allergies as of 09/15/2018   No Known Allergies     Medication List    TAKE these medications   ferrous sulfate 325 (65 FE) MG tablet Take 325 mg by mouth daily with breakfast.   prenatal multivitamin Tabs tablet Take 1 tablet by mouth daily at 12 noon.      Activity: activity as tolerated Diet: regular diet Wound Care:  none needed  Follow-up with Labor and delivery for induction in 2 days.   SIGNED: Natale MilchChristanna R Titilayo Hagans 09/15/2018 10:47 PM

## 2018-09-16 ENCOUNTER — Ambulatory Visit
Admission: RE | Admit: 2018-09-16 | Discharge: 2018-09-16 | Disposition: A | Discharge status: Home / Self Care | Payer: Medicaid Other | Source: Ambulatory Visit | Attending: Nurse Practitioner | Admitting: Nurse Practitioner

## 2018-09-16 DIAGNOSIS — Z3A00 Weeks of gestation of pregnancy not specified: Secondary | ICD-10-CM | POA: Insufficient documentation

## 2018-09-16 DIAGNOSIS — R000 Tachycardia, unspecified: Secondary | ICD-10-CM

## 2018-09-16 DIAGNOSIS — O99213 Obesity complicating pregnancy, third trimester: Secondary | ICD-10-CM

## 2018-09-17 ENCOUNTER — Inpatient Hospital Stay: Admission: AD | Admit: 2018-09-17 | Payer: Self-pay | Source: Home / Self Care

## 2018-09-17 ENCOUNTER — Encounter: Payer: Self-pay | Admitting: *Deleted

## 2018-09-17 ENCOUNTER — Other Ambulatory Visit: Payer: Self-pay

## 2018-09-17 ENCOUNTER — Inpatient Hospital Stay
Admission: EM | Admit: 2018-09-17 | Discharge: 2018-09-20 | DRG: 784 | Disposition: A | Discharge status: Home / Self Care | Payer: Medicaid Other | Attending: Certified Nurse Midwife | Admitting: Certified Nurse Midwife

## 2018-09-17 DIAGNOSIS — D62 Acute posthemorrhagic anemia: Secondary | ICD-10-CM | POA: Diagnosis not present

## 2018-09-17 DIAGNOSIS — O99214 Obesity complicating childbirth: Secondary | ICD-10-CM | POA: Diagnosis present

## 2018-09-17 DIAGNOSIS — O26893 Other specified pregnancy related conditions, third trimester: Secondary | ICD-10-CM | POA: Diagnosis present

## 2018-09-17 DIAGNOSIS — Z3A39 39 weeks gestation of pregnancy: Secondary | ICD-10-CM

## 2018-09-17 DIAGNOSIS — O9902 Anemia complicating childbirth: Secondary | ICD-10-CM

## 2018-09-17 DIAGNOSIS — O480 Post-term pregnancy: Secondary | ICD-10-CM | POA: Diagnosis present

## 2018-09-17 DIAGNOSIS — Z302 Encounter for sterilization: Secondary | ICD-10-CM | POA: Diagnosis not present

## 2018-09-17 DIAGNOSIS — Z3490 Encounter for supervision of normal pregnancy, unspecified, unspecified trimester: Secondary | ICD-10-CM | POA: Diagnosis present

## 2018-09-17 DIAGNOSIS — O134 Gestational [pregnancy-induced] hypertension without significant proteinuria, complicating childbirth: Principal | ICD-10-CM | POA: Diagnosis present

## 2018-09-17 DIAGNOSIS — O99824 Streptococcus B carrier state complicating childbirth: Secondary | ICD-10-CM | POA: Diagnosis present

## 2018-09-17 DIAGNOSIS — O9081 Anemia of the puerperium: Secondary | ICD-10-CM | POA: Diagnosis not present

## 2018-09-17 HISTORY — DX: Morbid (severe) obesity due to excess calories: E66.01

## 2018-09-17 HISTORY — DX: Gestational diabetes mellitus in pregnancy, unspecified control: O24.419

## 2018-09-17 HISTORY — DX: Body Mass Index (BMI) 40.0 and over, adult: Z684

## 2018-09-17 LAB — CBC
HCT: 30.5 % — ABNORMAL LOW (ref 36.0–46.0)
Hemoglobin: 9.3 g/dL — ABNORMAL LOW (ref 12.0–15.0)
MCH: 25.9 pg — ABNORMAL LOW (ref 26.0–34.0)
MCHC: 30.5 g/dL (ref 30.0–36.0)
MCV: 85 fL (ref 80.0–100.0)
Platelets: 305 10*3/uL (ref 150–400)
RBC: 3.59 MIL/uL — ABNORMAL LOW (ref 3.87–5.11)
RDW: 18.3 % — ABNORMAL HIGH (ref 11.5–15.5)
WBC: 7.7 10*3/uL (ref 4.0–10.5)
nRBC: 0 % (ref 0.0–0.2)

## 2018-09-17 LAB — RAPID HIV SCREEN (HIV 1/2 AB+AG)
HIV 1/2 Antibodies: NONREACTIVE
HIV-1 P24 Antigen - HIV24: NONREACTIVE

## 2018-09-17 LAB — URINE DRUG SCREEN, QUALITATIVE (ARMC ONLY)
Amphetamines, Ur Screen: NOT DETECTED
Barbiturates, Ur Screen: NOT DETECTED
Benzodiazepine, Ur Scrn: NOT DETECTED
Cannabinoid 50 Ng, Ur ~~LOC~~: NOT DETECTED
Cocaine Metabolite,Ur ~~LOC~~: NOT DETECTED
MDMA (Ecstasy)Ur Screen: NOT DETECTED
Methadone Scn, Ur: NOT DETECTED
Opiate, Ur Screen: NOT DETECTED
Phencyclidine (PCP) Ur S: NOT DETECTED
Tricyclic, Ur Screen: NOT DETECTED

## 2018-09-17 LAB — COMPREHENSIVE METABOLIC PANEL
ALT: 11 U/L (ref 0–44)
AST: 14 U/L — ABNORMAL LOW (ref 15–41)
Albumin: 2.8 g/dL — ABNORMAL LOW (ref 3.5–5.0)
Alkaline Phosphatase: 100 U/L (ref 38–126)
Anion gap: 6 (ref 5–15)
BUN: 5 mg/dL — ABNORMAL LOW (ref 6–20)
CO2: 22 mmol/L (ref 22–32)
Calcium: 8.3 mg/dL — ABNORMAL LOW (ref 8.9–10.3)
Chloride: 106 mmol/L (ref 98–111)
Creatinine, Ser: 0.47 mg/dL (ref 0.44–1.00)
GFR calc Af Amer: >60 mL/min (ref >60)
GFR calc non Af Amer: >60 mL/min (ref >60)
Glucose, Bld: 77 mg/dL (ref 70–99)
Potassium: 3.9 mmol/L (ref 3.5–5.1)
Sodium: 134 mmol/L — ABNORMAL LOW (ref 135–145)
Total Bilirubin: 0.7 mg/dL (ref 0.3–1.2)
Total Protein: 7 g/dL (ref 6.5–8.1)

## 2018-09-17 LAB — PROTEIN / CREATININE RATIO, URINE
Creatinine, Urine: 121 mg/dL
Protein Creatinine Ratio: 0.1 mg/mg{Cre} (ref 0.00–0.15)
Total Protein, Urine: 12 mg/dL

## 2018-09-17 MED ORDER — MISOPROSTOL 25 MCG QUARTER TABLET
ORAL_TABLET | ORAL | Status: AC
  Administered 2018-09-17: 25 ug
  Filled 2018-09-17: qty 1

## 2018-09-17 MED ORDER — SODIUM CHLORIDE 0.9 % IV SOLN
5.0000 10*6.[IU] | Freq: Once | INTRAVENOUS | Status: AC
  Administered 2018-09-17: 5 10*6.[IU] via INTRAVENOUS
  Filled 2018-09-17: qty 5

## 2018-09-17 MED ORDER — SODIUM CHLORIDE (PF) 0.9 % IJ SOLN
INTRAMUSCULAR | Status: AC
  Filled 2018-09-17: qty 50

## 2018-09-17 MED ORDER — MISOPROSTOL 200 MCG PO TABS
800.0000 ug | ORAL_TABLET | Freq: Once | ORAL | Status: DC | PRN
  Filled 2018-09-17: qty 4

## 2018-09-17 MED ORDER — OXYTOCIN 40 UNITS IN NORMAL SALINE INFUSION - SIMPLE MED
2.5000 [IU]/h | INTRAVENOUS | Status: DC
  Administered 2018-09-18: 700 mL via INTRAVENOUS
  Filled 2018-09-17 (×2): qty 1000

## 2018-09-17 MED ORDER — PENICILLIN G 3 MILLION UNITS IVPB - SIMPLE MED
3.0000 10*6.[IU] | INTRAVENOUS | Status: DC
  Administered 2018-09-17 – 2018-09-18 (×5): 3 10*6.[IU] via INTRAVENOUS
  Filled 2018-09-17 (×4): qty 100
  Filled 2018-09-17 (×2): qty 3
  Filled 2018-09-17 (×9): qty 100

## 2018-09-17 MED ORDER — ONDANSETRON HCL 4 MG/2ML IJ SOLN
4.0000 mg | Freq: Four times a day (QID) | INTRAMUSCULAR | Status: DC | PRN
  Administered 2018-09-18 (×2): 4 mg via INTRAVENOUS
  Filled 2018-09-17 (×2): qty 2

## 2018-09-17 MED ORDER — MISOPROSTOL 25 MCG QUARTER TABLET
ORAL_TABLET | ORAL | Status: AC
  Administered 2018-09-17: 25 ug via VAGINAL
  Filled 2018-09-17: qty 1

## 2018-09-17 MED ORDER — OXYTOCIN BOLUS FROM INFUSION: 500.0000 mL | Freq: Once | INTRAVENOUS | Status: DC

## 2018-09-17 MED ORDER — OXYTOCIN 10 UNIT/ML IJ SOLN
INTRAMUSCULAR | Status: AC
  Filled 2018-09-17: qty 2

## 2018-09-17 MED ORDER — LIDOCAINE HCL (PF) 1 % IJ SOLN
30.0000 mL | INTRAMUSCULAR | Status: DC | PRN
  Filled 2018-09-17: qty 30

## 2018-09-17 MED ORDER — AMMONIA AROMATIC IN INHA
0.3000 mL | Freq: Once | RESPIRATORY_TRACT | Status: DC | PRN
  Filled 2018-09-17: qty 10

## 2018-09-17 MED ORDER — LACTATED RINGERS IV SOLN: 500.0000 mL | INTRAVENOUS | Status: DC | PRN

## 2018-09-17 MED ORDER — LACTATED RINGERS IV SOLN
INTRAVENOUS | Status: DC
  Administered 2018-09-17 – 2018-09-18 (×3): via INTRAVENOUS

## 2018-09-17 NOTE — H&P (Signed)
OB History & Physical   History of Present Illness:  Chief Complaint:  Here for induction of labor HPI:  Sheryl Monroe is a 23 y.o. [id number] female with EDC= 09/20/2018 at 45w4ddated by a 34wk ultrasound.  Her pregnancy has been complicated by late onset prenatal care, anemia and morbid obesity. She began her care in the third trimester [facility]. Has been seen twice at [facility]. She did see Dr PRanda Lynnfor anesthesia evaluation due to morbid obesity and airway exam was reassuring. She has gained 62# with this pregnancy.   She presents to L&D for elective induction of labor. Denies vaginal bleeding and leakage of water. Has had some mucous discharge. Baby active.       Maternal Medical History:   Past Medical History:  Diagnosis Date  . Bacterial vaginosis   . Chlamydia   . Gestational diabetes    with first pregnancy  . Morbid obesity with BMI of 50.0-59.9, adult ([facility]     Past Surgical History:  Procedure Laterality Date  . NO PAST SURGERIES     OB History:  OB History  Gravida Para Term Preterm AB Living  _0 0 0 4  SAB TAB Ectopic Multiple Live Births  0 0 0 0 4    # Outcome Date GA Lbr Len/2nd Weight Sex Delivery Anes PTL Lv  5 Current           4 Term 10/21/17 [id number] g M Vag-Spont   LIV  3 Term 05/10/15 [id number] g M Vag-Spont   LIV  2 Term 08/31/13 [id number] g M Vag-Spont None  LIV  1 Term 03/24/12 [id number] g M Vag-Spont None  LIV    No Known Allergies  Prior to Admission medications   Medication Sig Start Date End Date Taking? Authorizing Provider  ferrous sulfate 325 (65 FE) MG tablet Take 325 mg by mouth daily with breakfast.   Yes [provider]  Prenatal Vit-Fe Fumarate-FA (PRENATAL MULTIVITAMIN) TABS tablet Take 1 tablet by mouth daily at 12 noon.   Yes [provider]     Social History: She  reports that she has never smoked. She has never used smokeless tobacco. She reports that she  does not drink alcohol or use drugs.  Family History: family history includes Breast cancer in her maternal grandmother; Diabetes in her paternal grandfather and paternal grandmother; Hypertension in her father, maternal grandmother, paternal grandfather, and paternal grandmother; Hypothyroidism in her maternal aunt.   Review of Systems: Negative x 10 systems reviewed except as noted in the HPI.      Physical Exam:  Vital Signs: BP (!) 127/91 (BP Location: Left Arm)   Pulse (!) 106   Temp 97.9 F (36.6 C) (Oral)   Resp 18   Ht _1  (1.651 m)   Wt (!) 140.2 kg   BMI 51.42 kg/m   Repeat 140/72 General: gravid BF in no acute distress.  HEENT: normocephalic, atraumatic Heart: regular rate & rhythm.  No murmurs Lungs: clear to auscultation bilaterally Abdomen: soft, gravid, non-tender;  EFW: 8 1/2# Pelvic:   External: Normal external female genitalia  Cervix: Dilation: 1.5 / Effacement (%): 40, 50 / Station: -3   Extremities: non-tender, symmetric, trace edema bilaterally.  DTRs: +1  Neurologic: Alert & oriented x 3.    Pertinent Results:  Prenatal Labs: Blood type/Rh O positive  Antibody screen negative  Rubella Varicella MMR  x2 Varivax x2  RPR Non reactive  HBsAg Non reactive  HIV Negative today  GC negative  Chlamydia negative  Genetic screening Late entry  1 hour GTT 85  3 hour GTT NA  GBS positive on 09/03/2018   Baseline FHR: baseline 125-130 with accelerations to 140s, moderate variability Toco: occasional contractions    Bedside Ultrasound:    Presentation: occiput posterior  Fluid: subjectively adequate  Placental Location: fundal  Assessment:  Sheryl Monroe is a 23 y.o. [id number] female at 60w4dfor elective induction of labor FWB: Cat 1 tracing Mild blood pressure elevations BMI 51-52 kg/m2 Bishop score=4  Plan:  1. Admit to Labor & Delivery - notified anesthesia of admission 2. CBC, T&S, Clrs, IVF, HIV, PC ratio, CMP, UDS 3. GBS  positive-started PPX with penicillin.   4. Consents obtained. 5. Discussed use of Cytotec and Pitocin for induction of labor. Aware of risks of hyperstimulation, fetal intolerance, and Cesarean section. Plan Cytotec induction and possibly foley balloon. 6. O POS/ MMR x2, Varivax x2,  7. Desires BTL, but 30 day papers not signed until 1/6-discussed doing the tubal as an interval lap tubal. 8. Breast feeding  CDalia Heading CNM  CDalia Heading 09/17/2018 11:04 AM

## 2018-09-17 NOTE — Progress Notes (Signed)
L&D Progress Note. Elective IOL: had 2 doses of Cytotec without much change in cervix. FOur hours after last dose of Cytotec, let patient up to move around and eat supper.   S: Feeling contractions, still mild. Had a pink tinged show.  Ate a regular diet for supper.  O:General: appears comfortable Patient Vitals for the past 24 hrs:  BP Temp Temp src Pulse Resp Height Weight  09/17/18 1938 131/68 98.1 F (36.7 C) Oral (!) 102 18 - -  09/17/18 1837 (!) 147/81 97.9 F (36.6 C) Oral 100 16 - -  09/17/18 1434 (!) 143/73 - - (!) 103 16 - -  09/17/18 1257 (!) 144/89 - - (!) 101 - - -  09/17/18 1141 (!) 143/90 (!) 97.5 F (36.4 C) Oral 96 16 - -  09/17/18 1046 140/72 - - (!) 101 - - -  09/17/18 0849 (!) 127/91 - Oral (!) 106 18 5\' 5"  (1.651 m) (!) 140.2 kg  09/17/18 0847 - 97.9 F (36.6 C) Oral - - - -  09/17/18 0800 - - - - - 5\' 5"  (1.651 m) (!) 150.6 kg   FHR: 135 with accelerations to 150s to 160s, very difficult picking up FHTs with external monitor, especially when patient lying on side or sitting up in chair TOCO: contractions irregular, but at one time contractions every 4 min.  Difficulty picking up contractions  Cervix: 1.5/50%/-3 and soft/ posterior Results for orders placed or performed during the hospital encounter of 09/17/18 (from the past 24 hour(s))  CBC     Status: Abnormal   Collection Time: 09/17/18  9:41 AM  Result Value Ref Range   WBC 7.7 4.0 - 10.5 K/uL   RBC 3.59 (L) 3.87 - 5.11 MIL/uL   Hemoglobin 9.3 (L) 12.0 - 15.0 g/dL   HCT 16.130.5 (L) 09.636.0 - 04.546.0 %   MCV 85.0 80.0 - 100.0 fL   MCH 25.9 (L) 26.0 - 34.0 pg   MCHC 30.5 30.0 - 36.0 g/dL   RDW 40.918.3 (H) 81.111.5 - 91.415.5 %   Platelets 305 150 - 400 K/uL   nRBC 0.0 0.0 - 0.2 %  Rapid HIV screen [facility] L&D dept ONLY)     Status: None   Collection Time: 09/17/18  9:41 AM  Result Value Ref Range   HIV-1 P24 Antigen - HIV24 NON REACTIVE NON REACTIVE   HIV 1/2 Antibodies NON REACTIVE NON REACTIVE   Interpretation (HIV Ag  Ab)      A non reactive test result means that HIV 1 or HIV 2 antibodies and HIV 1 p24 antigen were not detected in the specimen.  Urine Drug Screen, Qualitative ([facility] only)     Status: None   Collection Time: 09/17/18  9:42 AM  Result Value Ref Range   Tricyclic, Ur Screen NONE DETECTED NONE DETECTED   Amphetamines, Ur Screen NONE DETECTED NONE DETECTED   MDMA (Ecstasy)Ur Screen NONE DETECTED NONE DETECTED   Cocaine Metabolite,Ur [place] NONE DETECTED NONE DETECTED   Opiate, Ur Screen NONE DETECTED NONE DETECTED   Phencyclidine (PCP) Ur S NONE DETECTED NONE DETECTED   Cannabinoid 50 Ng, Ur  NONE DETECTED NONE DETECTED   Barbiturates, Ur Screen NONE DETECTED NONE DETECTED   Benzodiazepine, Ur Scrn NONE DETECTED NONE DETECTED   Methadone Scn, Ur NONE DETECTED NONE DETECTED  Protein / creatinine ratio, urine     Status: None   Collection Time: 09/17/18  9:42 AM  Result Value Ref Range   Creatinine, Urine 121 mg/dL  Total Protein, Urine 12 mg/dL   Protein Creatinine Ratio 0.10 0.00 - 0.15 mg/mg[Cre]  Type and screen     Status: None   Collection Time: 09/17/18 11:16 AM  Result Value Ref Range   ABO/RH(D) O POS    Antibody Screen NEG    Sample Expiration      09/20/2018 Performed at [facility], [address] [place], [place] [address]   Comprehensive metabolic panel     Status: Abnormal   Collection Time: 09/17/18 11:16 AM  Result Value Ref Range   Sodium 134 (L) 135 - 145 mmol/L   Potassium 3.9 3.5 - 5.1 mmol/L   Chloride 106 98 - 111 mmol/L   CO2 22 22 - 32 mmol/L   Glucose, Bld 77 70 - 99 mg/dL   BUN 5 (L) 6 - 20 mg/dL   Creatinine, Ser 3.64 0.44 - 1.00 mg/dL   Calcium 8.3 (L) 8.9 - 10.3 mg/dL   Total Protein 7.0 6.5 - 8.1 g/dL   Albumin 2.8 (L) 3.5 - 5.0 g/dL   AST 14 (L) 15 - 41 U/L   ALT 11 0 - 44 U/L   Alkaline Phosphatase 100 38 - 126 U/L   Total Bilirubin 0.7 0.3 - 1.2 mg/dL   GFR calc non Af Amer >60 >60 mL/min   GFR calc Af Amer >60 >60 mL/min    Anion gap 6 5 - 15   A: Elective IOL-not much progress Gestational hypertension-blood pressures remain in the mild range, Preeclampsia labs were normal. FWB: Cat 1 tracing P: 30 cc foley bulb placed intracervically. Monitor for the foley falling out  Farrel Conners, [place]

## 2018-09-18 ENCOUNTER — Inpatient Hospital Stay: Payer: Medicaid Other | Admitting: Certified Registered Nurse Anesthetist

## 2018-09-18 ENCOUNTER — Encounter
Admission: EM | Disposition: A | Discharge status: Home / Self Care | Payer: Self-pay | Source: Home / Self Care | Attending: Certified Nurse Midwife

## 2018-09-18 DIAGNOSIS — Z302 Encounter for sterilization: Secondary | ICD-10-CM

## 2018-09-18 LAB — CBC
HCT: 31 % — ABNORMAL LOW (ref 36.0–46.0)
Hemoglobin: 9.2 g/dL — ABNORMAL LOW (ref 12.0–15.0)
MCH: 25.8 pg — ABNORMAL LOW (ref 26.0–34.0)
MCHC: 29.7 g/dL — ABNORMAL LOW (ref 30.0–36.0)
MCV: 87.1 fL (ref 80.0–100.0)
Platelets: 273 10*3/uL (ref 150–400)
RBC: 3.56 MIL/uL — ABNORMAL LOW (ref 3.87–5.11)
RDW: 18.4 % — ABNORMAL HIGH (ref 11.5–15.5)
WBC: 10.8 10*3/uL — ABNORMAL HIGH (ref 4.0–10.5)
nRBC: 0 % (ref 0.0–0.2)

## 2018-09-18 SURGERY — Surgical Case
Anesthesia: Spinal

## 2018-09-18 MED ORDER — BUPIVACAINE ON-Q PAIN PUMP (FOR ORDER SET NO CHG): INJECTION | Status: DC

## 2018-09-18 MED ORDER — IBUPROFEN 600 MG PO TABS
600.0000 mg | ORAL_TABLET | Freq: Four times a day (QID) | ORAL | Status: DC | PRN
  Administered 2018-09-18 – 2018-09-20 (×3): 600 mg via ORAL
  Filled 2018-09-18 (×2): qty 1

## 2018-09-18 MED ORDER — SIMETHICONE 80 MG PO CHEW: 80.0000 mg | CHEWABLE_TABLET | ORAL | Status: DC | PRN

## 2018-09-18 MED ORDER — EPHEDRINE SULFATE-NACL 50-0.9 MG/10ML-% IV SOSY
PREFILLED_SYRINGE | INTRAVENOUS | Status: DC | PRN
  Administered 2018-09-18: 15 mg via INTRAVENOUS
  Administered 2018-09-18: 5 mg via INTRAVENOUS

## 2018-09-18 MED ORDER — SODIUM CHLORIDE 0.9% IV SOLUTION: Freq: Once | INTRAVENOUS | Status: DC

## 2018-09-18 MED ORDER — BUTORPHANOL TARTRATE 2 MG/ML IJ SOLN
INTRAMUSCULAR | Status: AC
  Filled 2018-09-18: qty 1

## 2018-09-18 MED ORDER — PRENATAL MULTIVITAMIN CH
1.0000 | ORAL_TABLET | Freq: Every day | ORAL | Status: DC
  Administered 2018-09-19 – 2018-09-20 (×2): 1 via ORAL
  Filled 2018-09-18 (×2): qty 1

## 2018-09-18 MED ORDER — TERBUTALINE SULFATE 1 MG/ML IJ SOLN: 0.2500 mg | Freq: Once | INTRAMUSCULAR | Status: DC | PRN

## 2018-09-18 MED ORDER — METHYLERGONOVINE MALEATE 0.2 MG/ML IJ SOLN
INTRAMUSCULAR | Status: AC
  Filled 2018-09-18: qty 1

## 2018-09-18 MED ORDER — FENTANYL CITRATE (PF) 100 MCG/2ML IJ SOLN
INTRAMUSCULAR | Status: DC | PRN
  Administered 2018-09-18: 20 ug via INTRATHECAL

## 2018-09-18 MED ORDER — WITCH HAZEL-GLYCERIN EX PADS: 1.0000 "application " | MEDICATED_PAD | CUTANEOUS | Status: DC | PRN

## 2018-09-18 MED ORDER — BUPIVACAINE HCL (PF) 0.5 % IJ SOLN
INTRAMUSCULAR | Status: DC | PRN
  Administered 2018-09-18: 10 mL

## 2018-09-18 MED ORDER — COCONUT OIL OIL: 1.0000 "application " | TOPICAL_OIL | Status: DC | PRN

## 2018-09-18 MED ORDER — BUTORPHANOL TARTRATE 2 MG/ML IJ SOLN
2.0000 mg | INTRAMUSCULAR | Status: DC | PRN
  Administered 2018-09-18 (×4): 2 mg via INTRAVENOUS
  Filled 2018-09-18 (×3): qty 1

## 2018-09-18 MED ORDER — MENTHOL 3 MG MT LOZG
1.0000 | LOZENGE | OROMUCOSAL | Status: DC | PRN
  Filled 2018-09-18: qty 9

## 2018-09-18 MED ORDER — LACTATED RINGERS IV SOLN
INTRAVENOUS | Status: DC
  Administered 2018-09-19: 11:00:00 via INTRAVENOUS

## 2018-09-18 MED ORDER — IBUPROFEN 600 MG PO TABS
ORAL_TABLET | ORAL | Status: AC
  Administered 2018-09-18: 600 mg via ORAL
  Filled 2018-09-18: qty 1

## 2018-09-18 MED ORDER — DIBUCAINE 1 % RE OINT: 1.0000 "application " | TOPICAL_OINTMENT | RECTAL | Status: DC | PRN

## 2018-09-18 MED ORDER — OXYTOCIN 40 UNITS IN NORMAL SALINE INFUSION - SIMPLE MED
1.0000 m[IU]/min | INTRAVENOUS | Status: DC
  Administered 2018-09-18: 2 m[IU]/min via INTRAVENOUS

## 2018-09-18 MED ORDER — SENNOSIDES-DOCUSATE SODIUM 8.6-50 MG PO TABS
2.0000 | ORAL_TABLET | ORAL | Status: DC
  Administered 2018-09-19 – 2018-09-20 (×2): 2 via ORAL
  Filled 2018-09-18 (×2): qty 2

## 2018-09-18 MED ORDER — SOD CITRATE-CITRIC ACID 500-334 MG/5ML PO SOLN
30.0000 mL | ORAL | Status: AC
  Administered 2018-09-18: 30 mL via ORAL
  Filled 2018-09-18: qty 15

## 2018-09-18 MED ORDER — BUPIVACAINE 0.25 % ON-Q PUMP DUAL CATH 400 ML
400.0000 mL | INJECTION | Status: DC
  Filled 2018-09-18: qty 400

## 2018-09-18 MED ORDER — ONDANSETRON HCL 4 MG/2ML IJ SOLN
INTRAMUSCULAR | Status: AC
  Filled 2018-09-18: qty 2

## 2018-09-18 MED ORDER — SODIUM CHLORIDE 0.9 % IV SOLN
INTRAVENOUS | Status: DC | PRN
  Administered 2018-09-18: 50 ug/min via INTRAVENOUS

## 2018-09-18 MED ORDER — BUPIVACAINE HCL (PF) 0.5 % IJ SOLN
INTRAMUSCULAR | Status: AC
  Filled 2018-09-18: qty 30

## 2018-09-18 MED ORDER — ONDANSETRON HCL 4 MG/2ML IJ SOLN
INTRAMUSCULAR | Status: DC | PRN
  Administered 2018-09-18: 4 mg via INTRAVENOUS

## 2018-09-18 MED ORDER — OXYTOCIN 40 UNITS IN NORMAL SALINE INFUSION - SIMPLE MED
2.5000 [IU]/h | INTRAVENOUS | Status: AC
  Administered 2018-09-19: 2.5 [IU]/h via INTRAVENOUS

## 2018-09-18 MED ORDER — SIMETHICONE 80 MG PO CHEW
80.0000 mg | CHEWABLE_TABLET | Freq: Three times a day (TID) | ORAL | Status: DC
  Administered 2018-09-19 – 2018-09-20 (×5): 80 mg via ORAL
  Filled 2018-09-18 (×4): qty 1

## 2018-09-18 MED ORDER — OXYTOCIN 40 UNITS IN NORMAL SALINE INFUSION - SIMPLE MED
INTRAVENOUS | Status: AC
  Filled 2018-09-18: qty 1000

## 2018-09-18 MED ORDER — OXYTOCIN 40 UNITS IN NORMAL SALINE INFUSION - SIMPLE MED
INTRAVENOUS | Status: AC
  Administered 2018-09-18: 2 m[IU]/min via INTRAVENOUS
  Filled 2018-09-18: qty 1000

## 2018-09-18 MED ORDER — FENTANYL CITRATE (PF) 100 MCG/2ML IJ SOLN
INTRAMUSCULAR | Status: AC
  Filled 2018-09-18: qty 2

## 2018-09-18 MED ORDER — OXYCODONE-ACETAMINOPHEN 5-325 MG PO TABS: 1.0000 | ORAL_TABLET | ORAL | Status: DC | PRN

## 2018-09-18 MED ORDER — SIMETHICONE 80 MG PO CHEW
80.0000 mg | CHEWABLE_TABLET | ORAL | Status: DC
  Filled 2018-09-18: qty 1

## 2018-09-18 MED ORDER — SODIUM CHLORIDE 0.9 % IV SOLN
500.0000 mg | Freq: Once | INTRAVENOUS | Status: AC
  Administered 2018-09-18: 500 mg via INTRAVENOUS
  Filled 2018-09-18: qty 500

## 2018-09-18 MED ORDER — CARBOPROST TROMETHAMINE 250 MCG/ML IM SOLN
INTRAMUSCULAR | Status: AC
  Filled 2018-09-18: qty 1

## 2018-09-18 MED ORDER — DEXTROSE 5 % IV SOLN
3.0000 g | INTRAVENOUS | Status: AC
  Administered 2018-09-18: 3 g via INTRAVENOUS
  Filled 2018-09-18: qty 3000

## 2018-09-18 MED ORDER — BUPIVACAINE IN DEXTROSE 0.75-8.25 % IT SOLN
INTRATHECAL | Status: DC | PRN
  Administered 2018-09-18: 1.8 mL via INTRATHECAL

## 2018-09-18 MED ORDER — DIPHENHYDRAMINE HCL 25 MG PO CAPS: 25.0000 mg | ORAL_CAPSULE | Freq: Four times a day (QID) | ORAL | Status: DC | PRN

## 2018-09-18 MED ORDER — PHENYLEPHRINE HCL 10 MG/ML IJ SOLN
INTRAMUSCULAR | Status: DC | PRN
  Administered 2018-09-18 (×3): 100 ug via INTRAVENOUS

## 2018-09-18 MED ORDER — BUPIVACAINE HCL 0.5 % IJ SOLN
20.0000 mL | INTRAMUSCULAR | Status: DC
  Filled 2018-09-18: qty 20

## 2018-09-18 SURGICAL SUPPLY — 36 items
ADH SKN CLS APL DERMABOND .7 (GAUZE/BANDAGES/DRESSINGS) ×1
BAG COUNTER SPONGE EZ (MISCELLANEOUS) ×3 IMPLANT
BAG SPNG 4X4 CLR HAZ (MISCELLANEOUS) ×2
CANISTER SUCT 3000ML PPV (MISCELLANEOUS) ×2 IMPLANT
CATH KIT ON-Q SILVERSOAK 5 (CATHETERS) ×2 IMPLANT
CATH KIT ON-Q SILVERSOAK 5IN (CATHETERS) ×4 IMPLANT
CHLORAPREP W/TINT 26ML (MISCELLANEOUS) ×4 IMPLANT
DERMABOND ADVANCED (GAUZE/BANDAGES/DRESSINGS) ×1
DERMABOND ADVANCED .7 DNX12 (GAUZE/BANDAGES/DRESSINGS) ×1 IMPLANT
DRSG OPSITE POSTOP 4X10 (GAUZE/BANDAGES/DRESSINGS) ×1 IMPLANT
DRSG TELFA 3X8 NADH (GAUZE/BANDAGES/DRESSINGS) IMPLANT
ELECT CAUTERY BLADE 6.4 (BLADE) ×2 IMPLANT
ELECT REM PT RETURN 9FT ADLT (ELECTROSURGICAL) ×2
ELECTRODE REM PT RTRN 9FT ADLT (ELECTROSURGICAL) ×1 IMPLANT
GAUZE SPONGE 4X4 12PLY STRL (GAUZE/BANDAGES/DRESSINGS) ×1 IMPLANT
GLOVE BIO SURGEON STRL SZ7 (GLOVE) ×4 IMPLANT
GLOVE INDICATOR 7.5 STRL GRN (GLOVE) ×4 IMPLANT
GOWN STRL REUS W/ TWL LRG LVL3 (GOWN DISPOSABLE) ×3 IMPLANT
GOWN STRL REUS W/TWL LRG LVL3 (GOWN DISPOSABLE) ×6
HEMOSTAT SURGICEL 2X3 (HEMOSTASIS) ×1 IMPLANT
KIT PREVENA INCISION MGT20CM45 (CANNISTER) ×1 IMPLANT
NS IRRIG 1000ML POUR BTL (IV SOLUTION) ×2 IMPLANT
PACK C SECTION AR (MISCELLANEOUS) ×2 IMPLANT
PAD DRESSING TELFA 3X8 NADH (GAUZE/BANDAGES/DRESSINGS) ×1 IMPLANT
PAD OB MATERNITY 4.3X12.25 (PERSONAL CARE ITEMS) ×2 IMPLANT
PAD PREP 24X41 OB/GYN DISP (PERSONAL CARE ITEMS) ×2 IMPLANT
RETRACTOR TRAXI PANNICULUS (MISCELLANEOUS) IMPLANT
STRIP CLOSURE SKIN 1/2X4 (GAUZE/BANDAGES/DRESSINGS) ×1 IMPLANT
SUT MNCRL AB 4-0 PS2 18 (SUTURE) ×2 IMPLANT
SUT PDS AB 1 TP1 96 (SUTURE) ×4 IMPLANT
SUT VIC AB 0 CTX 36 (SUTURE) ×4
SUT VIC AB 0 CTX36XBRD ANBCTRL (SUTURE) ×2 IMPLANT
SUT VIC AB 2-0 CT1 27 (SUTURE) ×2
SUT VIC AB 2-0 CT1 36 (SUTURE) ×2 IMPLANT
SUT VIC AB 2-0 CT1 TAPERPNT 27 (SUTURE) IMPLANT
TRAXI PANNICULUS RETRACTOR (MISCELLANEOUS) ×1

## 2018-09-18 NOTE — Progress Notes (Signed)
L&D progress Note  S: would like something for pain. Feeling contractions, but not able to pick up with toco.  O: BP 131/84, 105/64 Called to see patient when the nurse was unable to find the FHR after 25 minutes. Foley bulb had fallen out around midnight at which time cervix was 4 cm/50%/-3 Nurse was able to find fetal heart tones just as I was walking into the room Cervix: 4-4.5/60%/-2 to -3 Unable to AROM with amniohook and AROM was accomplished with a fetal scalp electrode.  FHR: 130 with accelerations to 160s with scalp stimulation Amniotic fluid was clear.  A: Some progress with foley bulb Reassuring fetal heart tracing  P: Stadol for pain Monitor progress and fetal maternal well being Will attempt to insert IUPC with one of the next exams  Farrel Conners, CNM

## 2018-09-18 NOTE — Progress Notes (Signed)
L&D Progress Note.   S: Feeling strong contractions. Just received another dose of Stadol.  O: BP 130/90   Pulse (!) 106   Temp 97.7 F (36.5 C) (Oral)   Resp 16   Ht 5\' 5"  (1.651 m)   Wt (!) 140.2 kg   SpO2 99%   BMI 51.42 kg/m   FHR: 130 baseline with accelerations to 140s to 160, moderate variability, repetitive early/ mild variables. IUPC: mvus 275. Has been adequate since 1100 this AM Cervix: 4.5/70%/-2. Baby still OP Have done knee chest, side lying release-baby remains OP  A: On PItocin for 11 hours without change AROM x 12 hours without change  P: Consulted DR Bonney Aid who is on his way in to discuss Cesarean section for failed induction. TxC x 2 units CBC stat  Farrel Conners, CNM

## 2018-09-18 NOTE — Anesthesia Procedure Notes (Signed)
Spinal  Patient location during procedure: OB Staffing Performed: anesthesiologist  Preanesthetic Checklist Completed: patient identified, site marked, surgical consent, pre-op evaluation, timeout performed, IV checked, risks and benefits discussed and monitors and equipment checked Spinal Block Patient position: sitting Prep: Betadine Patient monitoring: heart rate, continuous pulse ox, blood pressure and cardiac monitor Approach: midline Location: L4-5 Injection technique: single-shot Needle Needle type: Whitacre and Introducer  Needle gauge: 25 G Needle length: 10 cm Assessment Sensory level: T4 Additional Notes Negative paresthesia. Negative blood return. Positive free-flowing CSF. Expiration date of kit checked and confirmed. Patient tolerated procedure well, without complications.

## 2018-09-18 NOTE — Progress Notes (Signed)
Sheryl Monroe, CNM called to the room because was unable to trace baby with ultrasound after trying for 25 minutes.

## 2018-09-18 NOTE — Op Note (Signed)
Preoperative Diagnosis: 1) 23 y.o. [id number] at 8040w5d 2) Failure to progress 3) Super morbidly obese Body mass index is 51.42 kg/m. 4) Undesired fertility 5) Anemia Hgb of 9.4g/dL   Postoperative Diagnosis: 1) 23 y.o. [id number] at 3840w5d 2) Failure to progress 3) Super morbidly obese Body mass index is 51.42 kg/m. 4) Undesired fertility 5) Anemia  Operation Performed: Repeat low transverse C-section via pfannenstiel skin incision and bilateral tubal ligation  Indiciation: Failure to progress at 4.5cm for 12-hrs, and with 6-hrs of adequate contractions, OP presentation  Anesthesia: Spinal  Primary Surgeon: Vena AustriaAndreas Sami Roes, MD   Assistant: Lawernce Pittsolleen Guitierrez, CNM this surgery required a high level surgical assistant with none other readily available  Preoperative Antibiotics: 3g ancef and 500mg  of azithromycin  Estimated Blood Loss: 600 mL  IV Fluids: 1100mL  Urine Output:: 100mL  Drains or Tubes: Foley to gravity drainage, ON-Q catheter system  Implants: none  Specimens Removed: none  Complications: none  Intraoperative Findings:  Normal tubes ovaries and uterus.  Delivery resulted in the birth of a liveborn female, APGAR (1 MIN): 8   APGAR (5 MINS): 9, weight 6lbs 9oz  Patient Condition:stable  Procedure in Detail:  Patient was taken to the operating room were she was administered regional anesthesia.  She was positioned in the supine position, prepped and draped in the  Usual sterile fashion.  Prior to proceeding with the case a time out was performed and the level of anesthetic was checked and noted to be adequate.  Utilizing the scalpel a pfannenstiel skin incision was made 2cm above the pubic symphysis utilizing the patient's pre-existing scar and carried down sharply to the the level of the rectus fascia.  The fascia was incised in the midline using the scalpel and then extended using mayo scissors.  The superior border of the rectus fascia was grasped with two  Kocher clamps and the underlying rectus muscles were dissected of the fascia using blunt dissection.  The median raphae was incised using Mayo scissors.   The inferior border of the rectus fascia was dissected of the rectus muscles in a similar fashion.  The midline was identified, the peritoneum was entered bluntly and expanded using manual tractions.  The uterus was noted to be in a none rotated position.  Next the bladder blade was placed retracting the bladder caudally.  A bladder flap was not created.  A low transverse incision was scored on the lower uterine segment.  The hysterotomy was entered bluntly using the operators finger.  The hysterotomy incision was extended using manual traction.  The operators hand was placed within the hysterotomy position noting the fetus to be within the OP position.  The vertex was grasped, flexed, brought to the incision, and delivered a traumatically using fundal pressure.  The remainder of the body delivered with ease.  The infant was suctioned, cord was clamped and cut before handing off to the awaiting neonatologist.  The placenta was delivered using manual extraction.  The right tube was grasped in a mid isthmic portion using a Babcock clamp, before being double suture ligated using a 0 chromic wheel.  The intervening nuckel of tube was excised using Metzenbaum scissors.  Complete cross section of tubal ostia visualized, and noted to be hemostatic  This procedure was then repeated in similar fashion for the patient left tube.    The uterus was exteriorized, wiped clean of clots and debris using two moist laps.  The hysterotomy was closed using a two layer closure of 0  Vicryl, with the first being a running locked, the second a vertical imbricating.  The uterus was returned to the abdomen.  The peritoneal gutters were wiped clean of clots and debris using two moist laps.  The hysterotomy incision was re-inspected noted to be hemostatic.  The rectus muscles were  re-approximated in the midline using a single 2-0 Vicryl mattress stitch.  The rectus muscles were inspected noted to be hemostatic.  The superior border of the rectus fascia was grasped with a Kocher clamp.  The ON-Q trocars were then placed 4cm above the superior border of the incision and tunneled subfascially.  The introducers were removed and the catheters were threaded through the sleeves after which the sleeves were removed.  The fascia was closed using a looped #1 PDS in a running fashion taking 1cm by 1cm bites.  The subcutaneous tissue was irrigated using warm saline, hemostasis achieved using the bovie.  The subcutaneous dead space was greater than 3cm and was closed.  The subcutaneous dead space was obliterated by using a 53-T 0 Chromic in a running fashion.  The skin was closed using 2-0 Vicryl subdermal sutures and staples.  A Provena wound vac was applied.  Sponge needle and instrument counts were corrects times two.  The patient tolerated the procedure well and was taken to the recovery room in stable condition.

## 2018-09-18 NOTE — Progress Notes (Signed)
   Subjective:  In to discuss failure to progress in labor with patient and management option  Objective:   Vitals: Blood pressure 130/90, pulse (!) 106, temperature 97.7 F (36.5 C), temperature source Oral, resp. rate 16, height 5\' 5"  (1.651 m), weight (!) 140.2 kg, SpO2 99 %, unknown if currently breastfeeding. General: NAD Abdomen: obese, soft, non-tender Cervical Exam:  Dilation: 4.5 Effacement (%): 70 Cervical Position: Posterior Station: -2 Presentation: Vertex Exam by:: Sharen Hones CNM  FHT: 120, moderate, occasional variable, +accels Toco: q2-51min  Results for orders placed or performed during the hospital encounter of 09/17/18 (from the past 24 hour(s))  Prepare RBC     Status: None   Collection Time: 09/18/18  4:40 PM  Result Value Ref Range   Order Confirmation      ORDER PROCESSED BY BLOOD BANK Performed at [facility], [address] [place], [place] [address]     Assessment:   23 y.o. [id number] [redacted]w[redacted]d IOL postdates  Plan:   1) Labor - The patient was counseled regarding risk and benefits to proceeding with Cesarean section to expedite delivery.  Risk of cesarean section were discussed including risk of bleeding and need for potential intraoperative or postoperative blood transfusion with a rate of approximately 5% quoted for all Cesarean sections, risk of injury to adjacent organs including but not limited to bowl and bladder, the need for additional surgical procedures to address such injuries, and the risk of infection.  The risk of continued attempts at vaginal delivery include but are note limited to worsening fetal or maternal status.  The likelihood of vaginal delivery after 17-hrs at 4.5 cm, and 6-hrs of adequate MVU is deemed unlikely.  Pelvis tested to 8lbs 4oz with EFW based on 37 week growth scan projected at 8lsb 1oz assuming normal 200g growth a week.  After consideration of options the patient is amenable to proceed with primary cesarean section  for delivery. - 3g ancef and 500mg  of IV azithromycin - ON-Q - Provena wound vac - Posterior fundal placenta - Starting Hgb of 9.4 2U of pRBC set up - Traxis and Alexis retractor  2) Fetus -  Cat I tracing  Vena Austria, MD, [facility], [facility] 09/18/2018, 5:01 PM

## 2018-09-18 NOTE — Anesthesia Post-op Follow-up Note (Signed)
Anesthesia QCDR form completed.        

## 2018-09-18 NOTE — Anesthesia Procedure Notes (Signed)
Date/Time: 09/18/2018 5:57 PM Performed by: Ginger Carne, CRNA Pre-anesthesia Checklist: Patient identified, Emergency Drugs available, Suction available, Patient being monitored and Timeout performed Patient Re-evaluated:Patient Re-evaluated prior to induction Oxygen Delivery Method: Nasal cannula Preoxygenation: Pre-oxygenation with 100% oxygen

## 2018-09-18 NOTE — Progress Notes (Signed)
L&D progress NOte   S: Has been resting on and off.   O: 116/61 98.7-105  FHR: 125-130 with aacelerations to 150s, moderate variability, some subtle decelerations with contractions Toco: 3 contractions in 10 minutes. mvus 150 IUPC inserted at 0330 when cervix was unchanged  A: inadequate labor Reassuring fetal heart tracing.  P: Pitocin augmentation.  Farrel Conners, CNM

## 2018-09-18 NOTE — Discharge Summary (Signed)
Obstetric Discharge Summary Reason for Admission: induction of labor Prenatal Procedures: none Intrapartum Procedures: cesarean: low cervical, transverse, failure to progress, tubal ligation Postpartum Procedures: P.P. tubal ligation Complications-Operative and Postpartum: none Hemoglobin  Date Value Ref Range Status  09/19/2018 8.4 (L) 12.0 - 15.0 g/dL Final  61/60/7371 06.2 g/dL   69/48/5462 70.3 g/dL    HCT  Date Value Ref Range Status  09/19/2018 28.0 (L) 36.0 - 46.0 % Final  09/18/2011 35 %   09/18/2011 35 %     Physical Exam:  General: alert, cooperative, appears stated age and no distress Lochia: appropriate Uterine Fundus: firm Incision: no significant drainage, wound vac in place, On Q pump with c/d/i dressing DVT Evaluation: No evidence of DVT seen on physical exam.  Discharge Diagnoses: Term Pregnancy-delivered  Discharge Information: Date: 09/20/2018 Activity: pelvic rest and no driving, no lifting more than 10 pounds, no tub bath for first 2 weeks Diet: routine Allergies as of 09/20/2018   No Known Allergies     Medication List    STOP taking these medications   ferrous sulfate 325 (65 FE) MG tablet     TAKE these medications   oxyCODONE-acetaminophen 5-325 MG tablet Commonly known as:  PERCOCET/ROXICET Take 1 tablet by mouth every 6 (six) hours as needed for up to 7 days for moderate pain or severe pain ((when tolerating fluids)).   prenatal multivitamin Tabs tablet Take 1 tablet by mouth daily at 12 noon.            Discharge Care Instructions  (From admission, onward)         Start     Ordered   09/20/18 0000  Discharge wound care:    Comments:  Keep incision dry, clean. Ok to shower. Wound vac and staples to be removed at 1 week post op visit.   09/20/18 1034          Condition: stable Discharge to: home Follow-up Information    Vena Austria, MD. Go on 09/28/2018.   Specialty:  Obstetrics and Gynecology Why:  at 9:50am for  wound re-check Contact information: [address] [place] [address] [phone]           Newborn Data: Live born female  Birth Weight: 6 lb 9.5 oz (2990 g) APGAR: 8, 9  Newborn Delivery   Birth date/time:  09/18/2018 18:28:00 Delivery type:  C-Section, Low Transverse C-section categorization:  Primary     Home with mother.  Tresea Mall, CNM 09/20/2018, 10:37 AM

## 2018-09-18 NOTE — Transfer of Care (Signed)
Immediate Anesthesia Transfer of Care Note  Patient: Sheryl Monroe  Procedure(s) Performed: Procedure(s): CESAREAN SECTION WITH BILATERAL TUBAL LIGATION (N/A)  Patient Location: PACU  Anesthesia Type:Spinal  Level of Consciousness: awake, alert  and oriented  Airway & Oxygen Therapy: Patient Spontanous Breathing and Patient connected to face mask oxygen  Post-op Assessment: Report given to RN and Post -op Vital signs reviewed and stable  Post vital signs: Reviewed and stable  Last Vitals:  Vitals:   09/18/18 1730 09/18/18 1949  BP: 131/85 (!) 109/52  Pulse: 96 (!) 102  Resp: 16 16  Temp: 36.6 C 36.5 C  SpO2:  100%    Complications: No apparent anesthesia complications

## 2018-09-19 ENCOUNTER — Encounter: Payer: Self-pay | Admitting: Obstetrics and Gynecology

## 2018-09-19 LAB — TYPE AND SCREEN
ABO/RH(D): O POS
Antibody Screen: NEGATIVE
Unit division: 0
Unit division: 0

## 2018-09-19 LAB — CBC
HCT: 28 % — ABNORMAL LOW (ref 36.0–46.0)
Hemoglobin: 8.4 g/dL — ABNORMAL LOW (ref 12.0–15.0)
MCH: 25.6 pg — ABNORMAL LOW (ref 26.0–34.0)
MCHC: 30 g/dL (ref 30.0–36.0)
MCV: 85.4 fL (ref 80.0–100.0)
Platelets: 247 10*3/uL (ref 150–400)
RBC: 3.28 MIL/uL — ABNORMAL LOW (ref 3.87–5.11)
RDW: 18.6 % — ABNORMAL HIGH (ref 11.5–15.5)
WBC: 10.7 10*3/uL — ABNORMAL HIGH (ref 4.0–10.5)
nRBC: 0 % (ref 0.0–0.2)

## 2018-09-19 LAB — BPAM RBC
Blood Product Expiration Date: 202002112359
Blood Product Expiration Date: 202002112359
Unit Type and Rh: 5100
Unit Type and Rh: 5100

## 2018-09-19 LAB — PREPARE RBC (CROSSMATCH)

## 2018-09-19 LAB — RPR: RPR Ser Ql: NONREACTIVE

## 2018-09-19 MED ORDER — FERROUS SULFATE 325 (65 FE) MG PO TABS
325.0000 mg | ORAL_TABLET | Freq: Two times a day (BID) | ORAL | Status: DC
  Administered 2018-09-19 – 2018-09-20 (×2): 325 mg via ORAL
  Filled 2018-09-19 (×2): qty 1

## 2018-09-19 MED ORDER — KETOROLAC TROMETHAMINE 30 MG/ML IJ SOLN
30.0000 mg | Freq: Four times a day (QID) | INTRAMUSCULAR | Status: AC
  Administered 2018-09-19 (×2): 30 mg via INTRAVENOUS
  Filled 2018-09-19 (×2): qty 1

## 2018-09-19 MED ORDER — HYDROCODONE-ACETAMINOPHEN 5-325 MG PO TABS
1.0000 | ORAL_TABLET | Freq: Four times a day (QID) | ORAL | Status: DC | PRN
  Administered 2018-09-19 – 2018-09-20 (×5): 2 via ORAL
  Filled 2018-09-19 (×5): qty 2

## 2018-09-19 NOTE — Progress Notes (Signed)
POD#1 pLTCS/BTL, gHTN Subjective:  Sitting up in bed, eating breakfast. Has had some incisional pain. Tolerating food well, denies nausea. Has not yet ambulated, but has been out of bed to stand at bedside. SCDs on. Foley in place, plan is to remove later today.  Objective:  Blood pressure (!) 144/95, pulse 91, temperature 98 F (36.7 C), temperature source Oral, resp. rate 18, height 5\' 5"  (1.651 m), weight (!) 140.2 kg, SpO2 100 %, currently breastfeeding.  General: NAD Pulmonary: no increased work of breathing Abdomen: non-distended, non-tender, fundus firm, lochia appropriate Incision: Negative pressure dressing and On-Q Extremities: No signs of DVT  Results for orders placed or performed during the hospital encounter of 09/17/18 (from the past 72 hour(s))  CBC     Status: Abnormal   Collection Time: 09/17/18  9:41 AM  Result Value Ref Range   WBC 7.7 4.0 - 10.5 K/uL   RBC 3.59 (L) 3.87 - 5.11 MIL/uL   Hemoglobin 9.3 (L) 12.0 - 15.0 g/dL   HCT 16.130.5 (L) 09.636.0 - 04.546.0 %   MCV 85.0 80.0 - 100.0 fL   MCH 25.9 (L) 26.0 - 34.0 pg   MCHC 30.5 30.0 - 36.0 g/dL   RDW 40.918.3 (H) 81.111.5 - 91.415.5 %   Platelets 305 150 - 400 K/uL   nRBC 0.0 0.0 - 0.2 %    Comment: Performed at [facility], [address] [place], [place] [address]  RPR     Status: None   Collection Time: 09/17/18  9:41 AM  Result Value Ref Range   RPR Ser Ql Non Reactive Non Reactive    Comment: (NOTE) Performed At: [facility] [address] [place], [place] [id number] Jolene SchimkeNagendra Sanjai MD QI:[phone]   Rapid HIV screen [facility] L&D dept ONLY)     Status: None   Collection Time: 09/17/18  9:41 AM  Result Value Ref Range   HIV-1 P24 Antigen - HIV24 NON REACTIVE NON REACTIVE   HIV 1/2 Antibodies NON REACTIVE NON REACTIVE   Interpretation (HIV Ag Ab)      A non reactive test result means that HIV 1 or HIV 2 antibodies and HIV 1 p24 antigen were not detected in the specimen.    Comment: Performed at [facility], [address] [place], [place] [address]  Urine Drug Screen, Qualitative [facility] only)     Status: None   Collection Time: 09/17/18  9:42 AM  Result Value Ref Range   Tricyclic, Ur Screen NONE DETECTED NONE DETECTED   Amphetamines, Ur Screen NONE DETECTED NONE DETECTED   MDMA (Ecstasy)Ur Screen NONE DETECTED NONE DETECTED   Cocaine Metabolite,Ur [place] NONE DETECTED NONE DETECTED   Opiate, Ur Screen NONE DETECTED NONE DETECTED   Phencyclidine (PCP) Ur S NONE DETECTED NONE DETECTED   Cannabinoid 50 Ng, Ur [place] NONE DETECTED NONE DETECTED   Barbiturates, Ur Screen NONE DETECTED NONE DETECTED   Benzodiazepine, Ur Scrn NONE DETECTED NONE DETECTED   Methadone Scn, Ur NONE DETECTED NONE DETECTED    Comment: (NOTE) Tricyclics + metabolites, urine    Cutoff 1000 ng/mL Amphetamines + metabolites, urine  Cutoff 1000 ng/mL MDMA (Ecstasy), urine              Cutoff 500 ng/mL Cocaine Metabolite, urine          Cutoff 300 ng/mL Opiate + metabolites, urine        Cutoff 300 ng/mL Phencyclidine (PCP), urine         Cutoff 25  ng/mL Cannabinoid, urine                 Cutoff 50 ng/mL Barbiturates + metabolites, urine  Cutoff 200 ng/mL Benzodiazepine, urine              Cutoff 200 ng/mL Methadone, urine                   Cutoff 300 ng/mL The urine drug screen provides only a preliminary, unconfirmed analytical test result and should not be used for non-medical purposes. Clinical consideration and professional judgment should be applied to any positive drug screen result due to possible interfering substances. A more specific alternate chemical method must be used in order to obtain a confirmed analytical result. Gas chromatography / mass spectrometry (GC/MS) is the preferred confirmat ory method. Performed at [facility], [address] [place], [place] [address]   Protein / creatinine ratio, urine     Status: None   Collection Time: 09/17/18  9:42 AM  Result Value Ref Range    Creatinine, Urine 121 mg/dL   Total Protein, Urine 12 mg/dL    Comment: NO NORMAL RANGE ESTABLISHED FOR THIS TEST   Protein Creatinine Ratio 0.10 0.00 - 0.15 mg/mg[Cre]    Comment: Performed at [facility], [address] [place], [place] [address]  Type and screen     Status: None   Collection Time: 09/17/18 11:16 AM  Result Value Ref Range   ABO/RH(D) O POS    Antibody Screen NEG    Sample Expiration 09/20/2018    Unit Number [id number]    Blood Component Type RED CELLS,LR    Unit division 00    Status of Unit REL FROM [facility]    Transfusion Status OK TO TRANSFUSE    Crossmatch Result      Compatible Performed at [facility], [address] [place], [place] [address]    Unit Number [id number]    Blood Component Type RED CELLS,LR    Unit division 00    Status of Unit REL FROM [facility]    Transfusion Status OK TO TRANSFUSE    Crossmatch Result Compatible   Comprehensive metabolic panel     Status: Abnormal   Collection Time: 09/17/18 11:16 AM  Result Value Ref Range   Sodium 134 (L) 135 - 145 mmol/L   Potassium 3.9 3.5 - 5.1 mmol/L   Chloride 106 98 - 111 mmol/L   CO2 22 22 - 32 mmol/L   Glucose, Bld 77 70 - 99 mg/dL   BUN 5 (L) 6 - 20 mg/dL   Creatinine, Ser 3.40 0.44 - 1.00 mg/dL   Calcium 8.3 (L) 8.9 - 10.3 mg/dL   Total Protein 7.0 6.5 - 8.1 g/dL   Albumin 2.8 (L) 3.5 - 5.0 g/dL   AST 14 (L) 15 - 41 U/L   ALT 11 0 - 44 U/L   Alkaline Phosphatase 100 38 - 126 U/L   Total Bilirubin 0.7 0.3 - 1.2 mg/dL   GFR calc non Af Amer >60 >60 mL/min   GFR calc Af Amer >60 >60 mL/min   Anion gap 6 5 - 15    Comment: Performed at [facility], [address] [place], [place] [address]  Prepare RBC     Status: None   Collection Time: 09/18/18  4:40 PM  Result Value Ref Range   Order Confirmation      ORDER PROCESSED BY BLOOD BANK Performed at [facility], [address] [place], [place] [address]   CBC     Status: Abnormal    Collection Time: 09/18/18  5:06 PM  Result Value Ref Range   WBC 10.8 (H) 4.0 - 10.5 K/uL   RBC 3.56 (L) 3.87 - 5.11 MIL/uL   Hemoglobin 9.2 (L) 12.0 - 15.0 g/dL   HCT 60.431.0 (L) 54.036.0 - 98.146.0 %   MCV 87.1 80.0 - 100.0 fL   MCH 25.8 (L) 26.0 - 34.0 pg   MCHC 29.7 (L) 30.0 - 36.0 g/dL   RDW 19.118.4 (H) 47.811.5 - 29.515.5 %   Platelets 273 150 - 400 K/uL   nRBC 0.0 0.0 - 0.2 %    Comment: Performed at [facility], [address] [place], [place] [address]  CBC     Status: Abnormal   Collection Time: 09/19/18  4:05 AM  Result Value Ref Range   WBC 10.7 (H) 4.0 - 10.5 K/uL   RBC 3.28 (L) 3.87 - 5.11 MIL/uL   Hemoglobin 8.4 (L) 12.0 - 15.0 g/dL   HCT 86.528.0 (L) 78.436.0 - 69.646.0 %   MCV 85.4 80.0 - 100.0 fL   MCH 25.6 (L) 26.0 - 34.0 pg   MCHC 30.0 30.0 - 36.0 g/dL   RDW 29.518.6 (H) 28.411.5 - 13.215.5 %   Platelets 247 150 - 400 K/uL   nRBC 0.0 0.0 - 0.2 %    Comment: Performed at [facility], [address] [place], [place] [address]     Assessment:   23 y.o. [id number] postoperative day # 1 in stable condition.  Plan:  1) Acute blood loss anemia - hemodynamically stable and asymptomatic - PO ferrous sulfate ordered  2) Blood Type --/--/O POS (01/18 1116) / Rubella Immune   / Varicella: has had vaccine x 2  3) TDAP status: given in third trimester 09/01/2018  4) Breast and formula feeeding/Contraception - BTL  5) Disposition: continue postpartum care, encourage ambulation.  Marcelyn BruinsJacelyn Linnea Todisco, CNM 09/19/2018

## 2018-09-19 NOTE — Lactation Note (Signed)
This note was copied from a baby's chart. Lactation Consultation Note  Patient Name: Girl Alliya Staude [id number] Date: 09/19/2018 Reason for consult: Initial assessment;Difficult latch;Term;Other (Comment)(Unsuccessful with breast feedinhg other 4 children)  Mom has only given bottles of formula since first breast feed.  Mom reports Journei breastfed well the first time she breast fed, but did not think she was getting anything.  Mom still willing to try to breast feed.  Hand expressed several times before got a drop of colostrum.  Mom could not get any colostrum when she tried to hand express which confirmed what she was thinking about not having enough.  Explained supply and demand and need for frequent stimulation to bring in mature milk and ensure a plentiful milk supply.  Explained risks of continuing to fill Journei up with bottles of formula to success of breast feeding.  Mom has large, firm areola/nipples that make it difficult to compress to get deep latch.  Demonstrated to mom how to sandwich breast, but she would never even hold her breast to get her latched or while she was breast feeding.   After several attempts of tickling her mouth, Journei opened her mouth wide and latched deeply beginning strong rhythmic sucks for long interval.  Heard a couple of swallows while at the breast. The entire time Journei was at the breast and suckling well, mom was continually making comments to me and the person she was on the phone with that she could not feel any milk coming out even though she could feel strong tugs at the breast and she did not think she or Journei was going to like breast feeding.  I kept having to tell her to hold Journei closer and not let her slip to tip of nipple.  Journei appeared satiated after breast feeding for 20 minutes on first breast and was sleepy when attempted on second breast.  Mom left her skin to skin after breast feeding.  Lactation name and number written on white board  and encouraged mom to call with any questions, concerns or if Journei demonstrated feeding cues which were explained to mom.  In less than an hour, mom had given Journei another bottle of formula.       Maternal Data Formula Feeding for Exclusion: No Has patient been taught Hand Expression?: Yes(Only got 1 drop after several attempts at hand expression) Does the patient have breastfeeding experience prior to this delivery?: Yes  Feeding Feeding Type: Bottle Fed - Formula Nipple Type: Slow - flow  LATCH Score Latch: Repeated attempts needed to sustain latch, nipple held in mouth throughout feeding, stimulation needed to elicit sucking reflex.  Audible Swallowing: A few with stimulation(Heard a couple of swallows while Journei was on the breast)  Type of Nipple: Everted at rest and after stimulation(Hard making it difficult to compress)  Comfort (Breast/Nipple): Soft / non-tender  Hold (Positioning): Full assist, staff holds infant at breast  LATCH Score: 6  Interventions Interventions: Breast feeding basics reviewed;Assisted with latch;Skin to skin;Breast massage;Hand express;Reverse pressure;Breast compression;Adjust position;Support pillows;Position options  Lactation Tools Discussed/Used WIC Program: Yes   Consult Status Consult Status: PRN    Louis Meckel 09/19/2018, 4:19 PM

## 2018-09-19 NOTE — Anesthesia Postprocedure Evaluation (Signed)
Anesthesia Post Note  Patient: Sheryl Monroe  Procedure(s) Performed: CESAREAN SECTION WITH BILATERAL TUBAL LIGATION (N/A )  Patient location during evaluation: Mother Baby Anesthesia Type: Spinal Level of consciousness: awake and alert and oriented Pain management: pain level controlled Vital Signs Assessment: post-procedure vital signs reviewed and stable Respiratory status: spontaneous breathing Cardiovascular status: stable Postop Assessment: patient able to bend at knees, adequate PO intake, no apparent nausea or vomiting, able to ambulate and spinal receding Anesthetic complications: no     Last Vitals:  Vitals:   09/19/18 0844 09/19/18 0946  BP:    Pulse:    Resp:    Temp:    SpO2: 100% 100%    Last Pain:  Vitals:   09/19/18 0820  TempSrc:   PainSc: 6                  Zachary George

## 2018-09-20 ENCOUNTER — Inpatient Hospital Stay: Payer: Self-pay | Admitting: Oncology

## 2018-09-20 LAB — SURGICAL PATHOLOGY

## 2018-09-20 MED ORDER — OXYCODONE-ACETAMINOPHEN 5-325 MG PO TABS: 1.0000 | ORAL_TABLET | Freq: Four times a day (QID) | ORAL | 0 refills | Status: AC | PRN

## 2018-09-20 NOTE — Clinical Social Work Maternal (Signed)
  CLINICAL SOCIAL WORK MATERNAL/CHILD NOTE  Patient Details  Name: Sheryl Monroe MRN: [id number] Date of Birth: [birth year]  Date:  09/20/2018  Clinical Social Worker Initiating Note:  Shela Leff MSW,LCSW Date/Time: Initiated:  09/20/18/      Child's Name:      Biological Parents:  Mother   Need for Interpreter:  None   Reason for Referral:      Address:  [place] [address]    Phone number:  [phone] (home)     Additional phone number: none  Household Members/Support Persons (HM/SP):       HM/SP Name Relationship DOB or Age  HM/SP -1        HM/SP -2        HM/SP -3        HM/SP -4        HM/SP -5        HM/SP -6        HM/SP -7        HM/SP -8          Natural Supports (not living in the home):  Extended Family, Immediate Family, Friends   Professional Supports: None   Employment:     Type of Work:     Education:      Homebound arranged:    Pensions consultant:      Other Resources:      Cultural/Religious Considerations Which May Impact Care:  none  Strengths:  Ability to meet basic needs , Compliance with medical plan , Home prepared for child    Psychotropic Medications:         Pediatrician:       Pediatrician List:   [facility]      Pediatrician Fax Number:    Risk Factors/Current Problems:  None   Cognitive State:  Able to Concentrate , Alert    Mood/Affect:  Calm , Relaxed    CSW Assessment: CSW met with patient this morning as her daughter was at her bedside. CSW explained role and purpose of visit. Patient states she has four other children in the home with her and that she gets support from her mom and other relatives. She states that they will be rotating coming to her home to assist. Patient states that father of newborn is also father to one of her other children and that he currently is in jail. She  stated that he is a supportive father to his children but she is unsure when he will be out of jail.   Patient denies any history of drug abuse or mental illness. She has been educated regarding postpartum depression and is able to verbalize that she will reach out.   Patient has all necessities for her newborn and has no other concerns or questions at this time.   CSW Plan/Description:  No Further Intervention Required/No Barriers to Discharge    Shela Leff, LCSW 09/20/2018, 11:20 AM

## 2018-09-20 NOTE — Progress Notes (Signed)
Discharge instructions complete and prescriptions given. Patient verbalizes understanding of teaching. RN taught patient how to take out On-Q pump at home. Patient verbalizes understanding of teaching. Patient to keep wound vac and provider will assess at postpartum follow up. Patient discharged home via wheelchair pushed by staff at 1320.

## 2018-09-22 NOTE — Anesthesia Preprocedure Evaluation (Signed)
Anesthesia Evaluation  Patient identified by MRN, date of birth, ID band Patient awake    Reviewed: Allergy & Precautions, H&P , NPO status , Patient's Chart, lab work & pertinent test results, reviewed documented beta blocker date and time   Airway Mallampati: II   Neck ROM: full    Dental  (+) Poor Dentition   Pulmonary neg pulmonary ROS,    Pulmonary exam normal        Cardiovascular negative cardio ROS Normal cardiovascular exam Rhythm:regular Rate:Normal     Neuro/Psych negative neurological ROS  negative psych ROS   GI/Hepatic negative GI ROS, Neg liver ROS,   Endo/Other  negative endocrine ROSdiabetes  Renal/GU negative Renal ROS  negative genitourinary   Musculoskeletal   Abdominal   Peds  Hematology negative hematology ROS (+)   Anesthesia Other Findings Past Medical History: No date: Bacterial vaginosis No date: Chlamydia No date: Gestational diabetes     Comment:  with first pregnancy No date: Morbid obesity with BMI of 50.0-59.9, adult [facility](HCC) Past Surgical History: 09/18/2018: CESAREAN SECTION WITH BILATERAL TUBAL LIGATION; N/A     Comment:  Procedure: CESAREAN SECTION WITH BILATERAL TUBAL               LIGATION;  Surgeon: Vena AustriaStaebler, Andreas, MD;  Location:               [facility] ORS;  Service: Obstetrics;  Laterality: N/A; No date: NO PAST SURGERIES BMI    Body Mass Index:  51.42 kg/m     Reproductive/Obstetrics negative OB ROS                             Anesthesia Physical Anesthesia Plan  ASA: II  Anesthesia Plan: General   Post-op Pain Management:    Induction:   PONV Risk Score and Plan:   Airway Management Planned:   Additional Equipment:   Intra-op Plan:   Post-operative Plan:   Informed Consent: I have reviewed the patients History and Physical, chart, labs and discussed the procedure including the risks, benefits and alternatives for the proposed  anesthesia with the patient or authorized representative who has indicated his/her understanding and acceptance.     Dental Advisory Given  Plan Discussed with: CRNA  Anesthesia Plan Comments:         Anesthesia Quick Evaluation

## 2018-09-22 NOTE — Anesthesia Postprocedure Evaluation (Signed)
Anesthesia Post Note  Patient: [name]  Procedure(s) Performed: CESAREAN SECTION WITH BILATERAL TUBAL LIGATION (N/A )  Patient location during evaluation: PACU Anesthesia Type: General Level of consciousness: awake and alert Pain management: pain level controlled Vital Signs Assessment: post-procedure vital signs reviewed and stable Respiratory status: spontaneous breathing, nonlabored ventilation, respiratory function stable and patient connected to nasal cannula oxygen Cardiovascular status: blood pressure returned to baseline and stable Postop Assessment: no apparent nausea or vomiting Anesthetic complications: no     Last Vitals:  Vitals:   09/20/18 0720 09/20/18 1120  BP: (!) 135/95 122/83  Pulse: 97 93  Resp: 15 18  Temp: 37.1 C 36.9 C  SpO2: 100% 99%    Last Pain:  Vitals:   09/20/18 1120  TempSrc: Oral  PainSc:                  [name]

## 2018-09-28 ENCOUNTER — Ambulatory Visit (INDEPENDENT_AMBULATORY_CARE_PROVIDER_SITE_OTHER): Payer: Self-pay | Admitting: Obstetrics and Gynecology

## 2018-09-28 ENCOUNTER — Encounter: Payer: Self-pay | Admitting: Obstetrics and Gynecology

## 2018-09-28 VITALS — BP 130/80 | Ht 65.0 in | Wt 315.0 lb

## 2018-09-28 DIAGNOSIS — Z4889 Encounter for other specified surgical aftercare: Secondary | ICD-10-CM

## 2018-09-28 DIAGNOSIS — Z5321 Procedure and treatment not carried out due to patient leaving prior to being seen by health care provider: Secondary | ICD-10-CM

## 2018-09-28 MED ORDER — SILVER SULFADIAZINE 1 % EX CREA: 1.0000 "application " | TOPICAL_CREAM | Freq: Every day | CUTANEOUS | 0 refills | Status: DC

## 2018-09-28 NOTE — Progress Notes (Signed)
Staples and provena removed reinforced with steri Some skin break down right aspect where provena sat rx silvadene

## 2018-10-17 ENCOUNTER — Other Ambulatory Visit: Payer: Self-pay | Admitting: Obstetrics and Gynecology

## 2019-04-18 ENCOUNTER — Telehealth: Payer: Self-pay

## 2019-04-18 NOTE — Telephone Encounter (Signed)
LVM for Sheryl Monroe to call me back

## 2019-04-18 NOTE — Telephone Encounter (Signed)
Cory from [facility] stating they need a updated sterilization form for [place]. Call back number is [phone]

## 2019-04-19 NOTE — Telephone Encounter (Signed)
I spoke to [employer]. He states the BTL form needs a doctor signature. I will give form to [place], medical records.

## 2019-04-19 NOTE — Telephone Encounter (Signed)
Left another VM 

## 2019-06-07 ENCOUNTER — Emergency Department: Payer: Medicaid Other

## 2019-06-07 ENCOUNTER — Other Ambulatory Visit: Payer: Self-pay

## 2019-06-07 ENCOUNTER — Encounter: Payer: Self-pay | Admitting: Emergency Medicine

## 2019-06-07 ENCOUNTER — Emergency Department
Admission: EM | Admit: 2019-06-07 | Discharge: 2019-06-07 | Disposition: A | Discharge status: Home / Self Care | Payer: Medicaid Other | Attending: Emergency Medicine | Admitting: Emergency Medicine

## 2019-06-07 DIAGNOSIS — Y929 Unspecified place or not applicable: Secondary | ICD-10-CM | POA: Insufficient documentation

## 2019-06-07 DIAGNOSIS — S70912A Unspecified superficial injury of left hip, initial encounter: Secondary | ICD-10-CM | POA: Diagnosis present

## 2019-06-07 DIAGNOSIS — Z79899 Other long term (current) drug therapy: Secondary | ICD-10-CM | POA: Diagnosis not present

## 2019-06-07 DIAGNOSIS — Y93I9 Activity, other involving external motion: Secondary | ICD-10-CM | POA: Insufficient documentation

## 2019-06-07 DIAGNOSIS — Y999 Unspecified external cause status: Secondary | ICD-10-CM | POA: Insufficient documentation

## 2019-06-07 DIAGNOSIS — M25561 Pain in right knee: Secondary | ICD-10-CM | POA: Insufficient documentation

## 2019-06-07 DIAGNOSIS — S7002XA Contusion of left hip, initial encounter: Secondary | ICD-10-CM | POA: Diagnosis not present

## 2019-06-07 MED ORDER — IBUPROFEN 800 MG PO TABS
800.0000 mg | ORAL_TABLET | Freq: Once | ORAL | Status: AC
  Administered 2019-06-07: 800 mg via ORAL
  Filled 2019-06-07: qty 1

## 2019-06-07 NOTE — ED Notes (Signed)
Pt refuses x-ray. EDP made aware.
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
## 2019-06-07 NOTE — ED Triage Notes (Signed)
PT arrives with complaints of right knee and left hip pain. No shortening or rotation observed.
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
## 2019-08-12 ENCOUNTER — Other Ambulatory Visit (HOSPITAL_COMMUNITY): Payer: Medicaid Other

## 2019-08-12 ENCOUNTER — Encounter (HOSPITAL_COMMUNITY): Payer: Self-pay | Admitting: Emergency Medicine

## 2019-08-12 ENCOUNTER — Emergency Department (HOSPITAL_COMMUNITY): Payer: Medicaid Other

## 2019-08-12 ENCOUNTER — Emergency Department (HOSPITAL_COMMUNITY)
Admission: EM | Admit: 2019-08-12 | Discharge: 2019-08-12 | Disposition: A | Discharge status: Home / Self Care | Payer: Medicaid Other | Attending: Emergency Medicine | Admitting: Emergency Medicine

## 2019-08-12 ENCOUNTER — Other Ambulatory Visit: Payer: Self-pay

## 2019-08-12 DIAGNOSIS — N938 Other specified abnormal uterine and vaginal bleeding: Secondary | ICD-10-CM | POA: Diagnosis not present

## 2019-08-12 DIAGNOSIS — N939 Abnormal uterine and vaginal bleeding, unspecified: Secondary | ICD-10-CM

## 2019-08-12 DIAGNOSIS — R1084 Generalized abdominal pain: Secondary | ICD-10-CM | POA: Diagnosis not present

## 2019-08-12 LAB — CBC WITH DIFFERENTIAL/PLATELET
Abs Immature Granulocytes: 0.01 10*3/uL (ref 0.00–0.07)
Basophils Absolute: 0 10*3/uL (ref 0.0–0.1)
Basophils Relative: 1 %
Eosinophils Absolute: 0.1 10*3/uL (ref 0.0–0.5)
Eosinophils Relative: 1 %
HCT: 36.9 % (ref 36.0–46.0)
Hemoglobin: 11.1 g/dL — ABNORMAL LOW (ref 12.0–15.0)
Immature Granulocytes: 0 %
Lymphocytes Relative: 33 %
Lymphs Abs: 1.9 10*3/uL (ref 0.7–4.0)
MCH: 24.9 pg — ABNORMAL LOW (ref 26.0–34.0)
MCHC: 30.1 g/dL (ref 30.0–36.0)
MCV: 82.9 fL (ref 80.0–100.0)
Monocytes Absolute: 0.4 10*3/uL (ref 0.1–1.0)
Monocytes Relative: 7 %
Neutro Abs: 3.3 10*3/uL (ref 1.7–7.7)
Neutrophils Relative %: 58 %
Platelets: 434 10*3/uL — ABNORMAL HIGH (ref 150–400)
RBC: 4.45 MIL/uL (ref 3.87–5.11)
RDW: 16.8 % — ABNORMAL HIGH (ref 11.5–15.5)
WBC: 5.7 10*3/uL (ref 4.0–10.5)
nRBC: 0 % (ref 0.0–0.2)

## 2019-08-12 LAB — BASIC METABOLIC PANEL
Anion gap: 10 (ref 5–15)
BUN: 8 mg/dL (ref 6–20)
CO2: 25 mmol/L (ref 22–32)
Calcium: 9.2 mg/dL (ref 8.9–10.3)
Chloride: 106 mmol/L (ref 98–111)
Creatinine, Ser: 0.75 mg/dL (ref 0.44–1.00)
GFR calc Af Amer: >60 mL/min (ref >60)
GFR calc non Af Amer: >60 mL/min (ref >60)
Glucose, Bld: 90 mg/dL (ref 70–99)
Potassium: 4 mmol/L (ref 3.5–5.1)
Sodium: 141 mmol/L (ref 135–145)

## 2019-08-12 LAB — WET PREP, GENITAL
Sperm: NONE SEEN
Trich, Wet Prep: NONE SEEN
Yeast Wet Prep HPF POC: NONE SEEN

## 2019-08-12 LAB — I-STAT BETA HCG BLOOD, ED (MC, WL, AP ONLY): I-stat hCG, quantitative: <5 m[IU]/mL (ref <5)

## 2019-08-12 NOTE — ED Provider Notes (Signed)
[facility] EMERGENCY DEPARTMENT Provider Note   CSN: [id number] Arrival date & time: 08/12/19  1478     History Chief Complaint  Patient presents with  . Abdominal Pain  . Vaginal Bleeding    Sheryl Monroe is a 23 y.o. female history of BV, chlamydia, gestational diabetes who presents for evaluation of 1 week of lower abdominal cramping, vaginal bleeding that began today.  Patient reports that for the last week or so, she has had some intermittent cramping to her lower abdomen.  She states it is mostly in the suprapubic region.  Occurs intermittently and usually resolves.  She has not had any associated nausea/vomiting/fevers.  She states that she has noted some slight dysuria.  She states that today, when she went to the bathroom, she is having some vaginal bleeding and passed a large clot.  She states her last normal period was in September 2020.  She reports that about the middle of October 2020, she was having some light vaginal spotting at that she was getting ready to start her menstrual cycle.  Patient states that she never had her full menstrual cycle so she took a pregnancy test which was positive.  She states she had never seen any OB/GYN or PCP follow-up.  She had called her OB/GYN but was unable to see them until 08/18/2019.  She states that since then, she has had intermittent vaginal spotting.  She reports she had a week in the end of November where she had light vaginal spotting but states she wasn't passing any clots or anything.  She has not noted any fevers, chest pain, difficulty breathing.  She is G6 P5.  She states that she had one miscarriage but no abortions.  She does report she has a history of having her tubes tied.  The history is provided by the patient.       Past Medical History:  Diagnosis Date  . Bacterial vaginosis   . Chlamydia   . Gestational diabetes    with first pregnancy  . Morbid obesity with BMI of 50.0-59.9, adult [facility])      Patient Active Problem List   Diagnosis Date Noted  . Postpartum care following cesarean delivery 09/20/2018  . Encounter for elective induction of labor 09/17/2018  . Social isolation 09/01/2013    Past Surgical History:  Procedure Laterality Date  . CESAREAN SECTION WITH BILATERAL TUBAL LIGATION N/A 09/18/2018   Procedure: CESAREAN SECTION WITH BILATERAL TUBAL LIGATION;  Surgeon: Vena Austria, MD;  Location: [facility] ORS;  Service: Obstetrics;  Laterality: N/A;  . NO PAST SURGERIES       OB History    Gravida  5   Para  5   Term  5   Preterm  0   AB  0   Living  5     SAB  0   TAB  0   Ectopic  0   Multiple  0   Live Births  5           Family History  Problem Relation Age of Onset  . Hypertension Father   . Diabetes Paternal Grandmother   . Hypertension Paternal Grandmother   . Hypertension Maternal Grandmother   . Breast cancer Maternal Grandmother   . Hypertension Paternal Grandfather   . Diabetes Paternal Grandfather   . Hypothyroidism Maternal Aunt     Social History   Tobacco Use  . Smoking status: Never Smoker  . Smokeless tobacco: Never Used  Substance Use Topics  . Alcohol use: No  . Drug use: No    Home Medications Prior to Admission medications   Medication Sig Start Date End Date Taking? Authorizing Provider  Prenatal Vit-Fe Fumarate-FA (PRENATAL MULTIVITAMIN) TABS tablet Take 1 tablet by mouth daily at 12 noon.    [provider]  silver sulfADIAZINE (SILVADENE) 1 % cream Apply 1 application topically daily. 09/28/18   Vena Austria, MD    Allergies    Patient has no known allergies.  Review of Systems   Review of Systems  Constitutional: Negative for fever.  Respiratory: Negative for shortness of breath.   Cardiovascular: Negative for chest pain.  Gastrointestinal: Positive for abdominal pain. Negative for nausea and vomiting.  Genitourinary: Positive for dysuria and vaginal bleeding. Negative for  hematuria.  Neurological: Negative for headaches.  All other systems reviewed and are negative.   Physical Exam Updated Vital Signs BP (!) 147/87 (BP Location: Right Wrist)   Pulse 86   Temp 98.8 F (37.1 C) (Oral)   Resp 15   SpO2 100%   Physical Exam Vitals and nursing note reviewed. Exam conducted with a chaperone present.  Constitutional:      Appearance: Normal appearance. She is well-developed.  HENT:     Head: Normocephalic and atraumatic.  Eyes:     General: Lids are normal.     Conjunctiva/sclera: Conjunctivae normal.     Pupils: Pupils are equal, round, and reactive to light.  Cardiovascular:     Rate and Rhythm: Normal rate and regular rhythm.     Pulses: Normal pulses.     Heart sounds: Normal heart sounds. No murmur. No friction rub. No gallop.   Pulmonary:     Effort: Pulmonary effort is normal.     Breath sounds: Normal breath sounds.  Abdominal:     Palpations: Abdomen is soft. Abdomen is not rigid.     Tenderness: There is abdominal tenderness in the suprapubic area. There is no guarding. Negative signs include McBurney's sign.     Comments: Abdomen soft, nondistended.  Tenderness noted diffusely to the suprapubic region.  No focal tenderness and guarding point.  No rigidity, guarding.  Genitourinary:    Cervix: Cervical bleeding present. No cervical motion tenderness.     Comments: The exam was performed with a chaperone present. Normal external female genitalia. No lesions, rash, or sores.  Large amount of bleeding in the vaginal canal with hemorrhage.  Unable to visualize cervix given amount of bleeding noted.  No CMT.  She does have some mild right adnexal tenderness.  No left adnexal tenderness.  No palpable masses bilaterally. Musculoskeletal:        General: Normal range of motion.     Cervical back: Full passive range of motion without pain.  Skin:    General: Skin is warm and dry.     Capillary Refill: Capillary refill takes less than 2 seconds.   Neurological:     Mental Status: She is alert and oriented to person, place, and time.  Psychiatric:        Speech: Speech normal.     ED Results / Procedures / Treatments   Labs (all labs ordered are listed, but only abnormal results are displayed) Labs Reviewed  WET PREP, GENITAL - Abnormal; Notable for the following components:      Result Value   Clue Cells Wet Prep HPF POC PRESENT (*)    WBC, Wet Prep HPF POC FEW (*)    All  other components within normal limits  CBC WITH DIFFERENTIAL/PLATELET - Abnormal; Notable for the following components:   Hemoglobin 11.1 (*)    MCH 24.9 (*)    RDW 16.8 (*)    Platelets 434 (*)    All other components within normal limits  BASIC METABOLIC PANEL  PREGNANCY, URINE  URINALYSIS, ROUTINE W REFLEX MICROSCOPIC  I-STAT BETA HCG BLOOD, ED ([facility], [facility], [facility] ONLY)  GC/CHLAMYDIA PROBE AMP ([place]) NOT AT [facility]    EKG None  Radiology US PELVIC COMPLETE W TRANSVAGINAL AND TORSION R/O  Result Date: 08/12/2019 CLINICAL DATA:  Vaginal bleeding. Lower abdominal pain and cramping. History of C-section with bilateral tubal ligation. Negative pregnancy test today. EXAM: TRANSABDOMINAL AND TRANSVAGINAL ULTRASOUND OF PELVIS DOPPLER ULTRASOUND OF OVARIES TECHNIQUE: Both transabdominal and transvaginal ultrasound examinations of the pelvis were performed. Transabdominal technique was performed for global imaging of the pelvis including uterus, ovaries, adnexal regions, and pelvic cul-de-sac. It was necessary to proceed with endovaginal exam following the transabdominal exam to visualize the ovaries. Color and duplex Doppler ultrasound was utilized to evaluate blood flow to the ovaries. COMPARISON:  None. FINDINGS: Uterus Measurements: 10.2 x 4.8 x 6.2 cm = volume: 160 mL. No fibroids or other mass visualized. Endometrium Thickness: 1.3 cm.  No focal abnormality visualized. Right ovary Measurements: 3.1 x 2.3 cm x 2.0 cm. There is a probable corpus luteum  measuring 1.0 x 1.7 x 1.3 cm. Left ovary Measurements: 2.9 x 1.5 x 2.1 cm = volume: 4.9 mL (transabdominal measurements, poorly visualized transvaginally). Normal appearance/no adnexal mass. Pulsed Doppler evaluation of the right ovary demonstrates normal low-resistance arterial and venous waveforms. Limited Doppler evaluation of the left ovary as it could not be visualized transvaginally. Other findings No significant free fluid. IMPRESSION: 1.  Normal appearance of uterus. 2. Limited assessment of the ovaries secondary to patient body habitus and shadowing bowel gas. Probable corpus luteum in the right ovary. Normal doppler evaluation of the right ovary. Doppler analysis could not be performed of the left ovary as it was not well visualized transvaginally. Electronically Signed   By: Emmaline KluverNancy  Ballantyne M.D.   On: 08/12/2019 11:53    Procedures Procedures (including critical care time)  Medications Ordered in ED Medications - No data to display  ED Course  I have reviewed the triage vital signs and the nursing notes.  Pertinent labs & imaging results that were available during my care of the patient were reviewed by me and considered in my medical decision making (see chart for details).    MDM Rules/Calculators/A&P       23 y.o. F who presents for evaluation of abdominal cramping x1 week and vaginal bleeding today.  Reports home pregnancy test in October that was positive.  States she has not followed up with OB/GYN to have an appointment to follow-up with them in December.  Has some light spotting having heavy vaginal bleeding with passing of clots.  Initially arrival, she is afebrile, nontoxic-appearing.  She has some mild suprapubic abdominal tenderness.  Plan to check labs, repeat test, pelvic.  I-STAT beta is negative.  CBC shows no leukocytosis.  Hemoglobin stable.  BMP is unremarkable.  Pelvic exam as document above.  Unable to visualize the cervix secondary to large amount of vaginal  bleeding.  No obvious hemorrhage.  She does have some mild right adnexal tenderness.  No CMT that would be concerning for PID.  Will plan for ultrasound to evaluate.   Ultrasound shows normal uterus.  Right  ovary has a probable corpus luteum.  Left ovary appears within normal limits.  No evidence of torsion noted on right side.  Unable to visualize Doppler on left ovary.  Discussed ultrasound results with Dr. Kennon Rounds (OB/GYN).  No further work-up needed here in the emergency department.  Agrees with plan for discharge and outpatient follow-up as previously scheduled.  Patient informed me that she has to leave the emergency department as her babysitter is leaving.  He still not pruritus in the urine.  She does report some slight dysuria but no other symptoms.  She does not want to wait and try and get urine.  Likely discharge.  Patient is hemodynamically stable.  She appears in no acute distress.  At this time, her exam is not concerning for torsion.  I discussed with her that this is most likely abnormal uterine bleeding.  She has an outpatient appointment follow-up with OB/GYN on 08/18/2019.  Instructed her to keep that appointment. At this time, patient exhibits no emergent life-threatening condition that require further evaluation in ED or admission. Patient had ample opportunity for questions and discussion. All patient's questions were answered with full understanding. Strict return precautions discussed. Patient expresses understanding and agreement to plan.   Portions of this note were generated with Lobbyist. Dictation errors may occur despite best attempts at proofreading.   Final Clinical Impression(s) / ED Diagnoses Final diagnoses:  Vaginal bleeding    Rx / DC Orders ED Discharge Orders    None       Desma Mcgregor 08/12/19 1241    Isla Pence, MD 08/12/19 1400

## 2019-08-12 NOTE — Discharge Instructions (Signed)
As we discussed, your work-up here looked good.  Keep your appointment with OB/GYN as previously scheduled.  You can take Tylenol and ibuprofen for pain.  Return the emergency department for any worsening pain, vaginal bleeding, passing out, fevers, vomiting, any other worsening concerning symptoms.

## 2019-08-12 NOTE — ED Notes (Signed)
Pt given water 

## 2019-08-12 NOTE — ED Triage Notes (Signed)
C/o lower abd cramping x 1 week.  Reports + home preg test in  October.  Having abnormal periods since then with just a few days of bleeding each month.  Reports vaginal bleeding this morning with "1 blood clot the size of a baseball".

## 2019-08-12 NOTE — ED Notes (Signed)
Discharge instructions discussed with Pt. Pt verbalized understanding. Pt stable and ambulatory.    

## 2019-08-12 NOTE — ED Notes (Signed)
Pt states US told her she could pee while she was over there, did not obtain sample, will provide one when able.

## 2019-08-15 LAB — GC/CHLAMYDIA PROBE AMP (~~LOC~~) NOT AT ARMC
Chlamydia: NEGATIVE
Neisseria Gonorrhea: NEGATIVE

## 2019-10-20 ENCOUNTER — Encounter (HOSPITAL_COMMUNITY): Payer: Self-pay

## 2019-10-20 ENCOUNTER — Emergency Department (HOSPITAL_COMMUNITY)
Admission: EM | Admit: 2019-10-20 | Discharge: 2019-10-20 | Discharge status: Against Medical Advice | Payer: Medicaid Other | Attending: Emergency Medicine | Admitting: Emergency Medicine

## 2019-10-20 ENCOUNTER — Emergency Department (HOSPITAL_COMMUNITY): Payer: Medicaid Other

## 2019-10-20 ENCOUNTER — Other Ambulatory Visit: Payer: Self-pay

## 2019-10-20 DIAGNOSIS — R45851 Suicidal ideations: Secondary | ICD-10-CM | POA: Insufficient documentation

## 2019-10-20 DIAGNOSIS — R0789 Other chest pain: Secondary | ICD-10-CM | POA: Insufficient documentation

## 2019-10-20 DIAGNOSIS — K0889 Other specified disorders of teeth and supporting structures: Secondary | ICD-10-CM | POA: Diagnosis not present

## 2019-10-20 DIAGNOSIS — R519 Headache, unspecified: Secondary | ICD-10-CM | POA: Insufficient documentation

## 2019-10-20 DIAGNOSIS — Z79899 Other long term (current) drug therapy: Secondary | ICD-10-CM | POA: Insufficient documentation

## 2019-10-20 LAB — COMPREHENSIVE METABOLIC PANEL
ALT: 10 U/L (ref 0–44)
AST: 12 U/L — ABNORMAL LOW (ref 15–41)
Albumin: 3.3 g/dL — ABNORMAL LOW (ref 3.5–5.0)
Alkaline Phosphatase: 69 U/L (ref 38–126)
Anion gap: 7 (ref 5–15)
BUN: 10 mg/dL (ref 6–20)
CO2: 23 mmol/L (ref 22–32)
Calcium: 9 mg/dL (ref 8.9–10.3)
Chloride: 109 mmol/L (ref 98–111)
Creatinine, Ser: 0.77 mg/dL (ref 0.44–1.00)
GFR calc Af Amer: >60 mL/min (ref >60)
GFR calc non Af Amer: >60 mL/min (ref >60)
Glucose, Bld: 95 mg/dL (ref 70–99)
Potassium: 3.9 mmol/L (ref 3.5–5.1)
Sodium: 139 mmol/L (ref 135–145)
Total Bilirubin: 0.5 mg/dL (ref 0.3–1.2)
Total Protein: 7.2 g/dL (ref 6.5–8.1)

## 2019-10-20 LAB — I-STAT BETA HCG BLOOD, ED (MC, WL, AP ONLY): I-stat hCG, quantitative: <5 m[IU]/mL (ref <5)

## 2019-10-20 LAB — CBC
HCT: 36.2 % (ref 36.0–46.0)
Hemoglobin: 10.7 g/dL — ABNORMAL LOW (ref 12.0–15.0)
MCH: 25.1 pg — ABNORMAL LOW (ref 26.0–34.0)
MCHC: 29.6 g/dL — ABNORMAL LOW (ref 30.0–36.0)
MCV: 84.8 fL (ref 80.0–100.0)
Platelets: 378 10*3/uL (ref 150–400)
RBC: 4.27 MIL/uL (ref 3.87–5.11)
RDW: 17.1 % — ABNORMAL HIGH (ref 11.5–15.5)
WBC: 7.3 10*3/uL (ref 4.0–10.5)
nRBC: 0 % (ref 0.0–0.2)

## 2019-10-20 LAB — RAPID URINE DRUG SCREEN, HOSP PERFORMED
Amphetamines: NOT DETECTED
Barbiturates: NOT DETECTED
Benzodiazepines: NOT DETECTED
Cocaine: NOT DETECTED
Opiates: NOT DETECTED
Tetrahydrocannabinol: POSITIVE — AB

## 2019-10-20 LAB — ACETAMINOPHEN LEVEL: Acetaminophen (Tylenol), Serum: <10 ug/mL — ABNORMAL LOW (ref 10–30)

## 2019-10-20 LAB — ETHANOL: Alcohol, Ethyl (B): <10 mg/dL (ref <10)

## 2019-10-20 LAB — SALICYLATE LEVEL: Salicylate Lvl: <7 mg/dL — ABNORMAL LOW (ref 7.0–30.0)

## 2019-10-20 LAB — TROPONIN I (HIGH SENSITIVITY)
Troponin I (High Sensitivity): 3 ng/L (ref <18)
Troponin I (High Sensitivity): 3 ng/L (ref <18)

## 2019-10-20 MED ORDER — DIPHENHYDRAMINE HCL 50 MG/ML IJ SOLN
12.5000 mg | Freq: Once | INTRAMUSCULAR | Status: AC
  Administered 2019-10-20: 12.5 mg via INTRAVENOUS
  Filled 2019-10-20: qty 1

## 2019-10-20 MED ORDER — LIDOCAINE VISCOUS HCL 2 % MT SOLN
15.0000 mL | Freq: Once | OROMUCOSAL | Status: AC
  Administered 2019-10-20: 15 mL via ORAL
  Filled 2019-10-20: qty 15

## 2019-10-20 MED ORDER — METOCLOPRAMIDE HCL 5 MG/ML IJ SOLN
10.0000 mg | Freq: Once | INTRAMUSCULAR | Status: AC
  Administered 2019-10-20: 10 mg via INTRAVENOUS
  Filled 2019-10-20: qty 2

## 2019-10-20 MED ORDER — ALUM & MAG HYDROXIDE-SIMETH 200-200-20 MG/5ML PO SUSP
30.0000 mL | Freq: Once | ORAL | Status: AC
  Administered 2019-10-20: 30 mL via ORAL
  Filled 2019-10-20: qty 30

## 2019-10-20 MED ORDER — SODIUM CHLORIDE 0.9 % IV BOLUS
500.0000 mL | Freq: Once | INTRAVENOUS | Status: AC
  Administered 2019-10-20: 500 mL via INTRAVENOUS

## 2019-10-20 NOTE — ED Triage Notes (Addendum)
Pt reports she took a lot of pain pills for pain to her head and chest. When asked if she was trying to harm herself pt stated "I just want it to all go away"  Pt reports taking unknown amount goody powders, aleve and tylenol since last night.  Pt states she feels like she is "border line suicidal" due to a lot of stress  Pt now endorses chest pain and left arm numbness x1 week

## 2019-10-20 NOTE — ED Provider Notes (Signed)
Care assumed from Dr. Madilyn Hook.  Patient here with 1 month of headache for which she took ibuprofen and Goody powder last night.  She developed chest pain which is since resolved.  She is awaiting labs including acetaminophen level and salicylate level.  She denies any suicidal thoughts or homicidal thoughts.  Acetaminophen and salicylate levels are nondetectable.  Patient's headache and chest pain have resolved.  TTS consult has not been completed.  Patient states she needs to leave as her babysitter is at home.  She states she is not suicidal or homicidal.  She does not appear to be responding to internal stimuli.  She appears to be able to make her own decisions.  She understands she will be leaving AGAINST MEDICAL ADVICE because her work-up has not been completed. Denies taking any acetaminophen.  Does not meet IVC criteria.   Glynn Octave, MD 10/20/19 2258

## 2019-10-20 NOTE — ED Notes (Signed)
Pt transported to xray 

## 2019-10-20 NOTE — Discharge Instructions (Addendum)
You are leaving AGAINST MEDICAL ADVICE and left before your work-up is completed.  Follow-up with your doctor and return to the ED if you wish to be reevaluated.

## 2019-10-20 NOTE — ED Provider Notes (Signed)
[facility] EMERGENCY DEPARTMENT Provider Note   CSN: [id number] Arrival date & time: 10/20/19  1307     History Chief Complaint  Patient presents with  . Suicidal  . Chest Pain    Sheryl Monroe is a 24 y.o. female.  The history is provided by the patient and medical records. No language interpreter was used.  Chest Pain  Sheryl Monroe is a 24 y.o. female who presents to the Emergency Department complaining of chest pain and headache. She presents to the emergency department for evaluation of headache and chest pain. She developed a left frontal headache about one month ago. Headaches are worse in the mornings. She also has associated left upper dental pain for the last two days. She has been taking ibuprofen daily for the last month for her headaches. Today the headache was worse with Association of the dental pain and she took two doses of ibuprofen 800 mg as well as two goodie powder minnies. Her family was concerned that she was going to harm herself and recommended she come in for evaluation. She denies any SI but states that she wants the pain to past. She developed some central chest discomfort later this morning but she describes the tenderness when she pushes in the area. She denies any fevers, nausea, vomiting, shortness of breath, Donald pain, diarrhea. She did have a miscarriage and December. She has a history of postpartum depression.    Past Medical History:  Diagnosis Date  . Bacterial vaginosis   . Chlamydia   . Gestational diabetes    with first pregnancy  . Morbid obesity with BMI of 50.0-59.9, adult [facility])     Patient Active Problem List   Diagnosis Date Noted  . Postpartum care following cesarean delivery 09/20/2018  . Encounter for elective induction of labor 09/17/2018  . Social isolation 09/01/2013    Past Surgical History:  Procedure Laterality Date  . CESAREAN SECTION WITH BILATERAL TUBAL LIGATION N/A 09/18/2018   Procedure:  CESAREAN SECTION WITH BILATERAL TUBAL LIGATION;  Surgeon: Vena Austria, MD;  Location: [facility] ORS;  Service: Obstetrics;  Laterality: N/A;  . NO PAST SURGERIES       OB History    Gravida  5   Para  5   Term  5   Preterm  0   AB  0   Living  5     SAB  0   TAB  0   Ectopic  0   Multiple  0   Live Births  5           Family History  Problem Relation Age of Onset  . Hypertension Father   . Diabetes Paternal Grandmother   . Hypertension Paternal Grandmother   . Hypertension Maternal Grandmother   . Breast cancer Maternal Grandmother   . Hypertension Paternal Grandfather   . Diabetes Paternal Grandfather   . Hypothyroidism Maternal Aunt     Social History   Tobacco Use  . Smoking status: Never Smoker  . Smokeless tobacco: Never Used  Substance Use Topics  . Alcohol use: No  . Drug use: No    Home Medications Prior to Admission medications   Medication Sig Start Date End Date Taking? Authorizing Provider  Prenatal Vit-Fe Fumarate-FA (PRENATAL MULTIVITAMIN) TABS tablet Take 1 tablet by mouth daily at 12 noon.    [provider]  silver sulfADIAZINE (SILVADENE) 1 % cream Apply 1 application topically daily. 09/28/18   Vena Austria, MD  Allergies    Patient has no known allergies.  Review of Systems   Review of Systems  Cardiovascular: Positive for chest pain.  All other systems reviewed and are negative.   Physical Exam Updated Vital Signs BP (!) 156/104 (BP Location: Right Arm)   Pulse 94   Temp 99.2 F (37.3 C) (Oral)   Resp 18   LMP 09/29/2019 (Approximate) Comment: patient reports no chance pregnancy 10/20/2019  SpO2 100%   Physical Exam Vitals and nursing note reviewed.  Constitutional:      Appearance: She is well-developed.  HENT:     Head: Normocephalic and atraumatic.     Mouth/Throat:     Comments: Poor dentition without significant erythema, edema Cardiovascular:     Rate and Rhythm: Normal rate and regular  rhythm.     Heart sounds: No murmur.  Pulmonary:     Effort: Pulmonary effort is normal. No respiratory distress.     Breath sounds: Normal breath sounds.  Abdominal:     Palpations: Abdomen is soft.     Tenderness: There is no abdominal tenderness. There is no guarding or rebound.  Musculoskeletal:        General: No tenderness.     Cervical back: Neck supple.  Skin:    General: Skin is warm and dry.  Neurological:     Mental Status: She is alert and oriented to person, place, and time.     Comments: 5/5 strength in all four extremities with sensation to light touch intact in all four extremities.  No asymmetry of facial movements.    Psychiatric:        Behavior: Behavior normal.     ED Results / Procedures / Treatments   Labs (all labs ordered are listed, but only abnormal results are displayed) Labs Reviewed  COMPREHENSIVE METABOLIC PANEL - Abnormal; Notable for the following components:      Result Value   Albumin 3.3 (*)    AST 12 (*)    All other components within normal limits  CBC - Abnormal; Notable for the following components:   Hemoglobin 10.7 (*)    MCH 25.1 (*)    MCHC 29.6 (*)    RDW 17.1 (*)    All other components within normal limits  ETHANOL  SALICYLATE LEVEL  ACETAMINOPHEN LEVEL  RAPID URINE DRUG SCREEN, HOSP PERFORMED  I-STAT BETA HCG BLOOD, ED ([facility], [facility], [facility] ONLY)  TROPONIN I (HIGH SENSITIVITY)  TROPONIN I (HIGH SENSITIVITY)    EKG EKG Interpretation  Date/Time:  Friday October 20 2019 13:37:24 EST Ventricular Rate:  94 PR Interval:  180 QRS Duration: 90 QT Interval:  362 QTC Calculation: 452 R Axis:   1 Text Interpretation: Normal sinus rhythm Minimal voltage criteria for LVH, may be normal variant ( R in aVL ) Cannot rule out Anterior infarct , age undetermined Abnormal ECG Confirmed by Quintella Reichert [phone]) on 10/20/2019 2:12:24 PM   Radiology DG Chest 2 View  Result Date: 10/20/2019 CLINICAL DATA:  Upper midline chest pain  beginning today with headache 3-4 weeks. EXAM: CHEST - 2 VIEW COMPARISON:  05/10/2018 FINDINGS: Lungs are adequately inflated and otherwise clear. Cardiomediastinal silhouette is normal. Mild prominence of the soft tissues. Bony structures are unremarkable. IMPRESSION: No active cardiopulmonary disease. Electronically Signed   By: Marin Olp M.D.   On: 10/20/2019 15:04   CT Head Wo Contrast  Result Date: 10/20/2019 CLINICAL DATA:  Progressive left frontal headache EXAM: CT HEAD WITHOUT CONTRAST TECHNIQUE: Contiguous axial images  were obtained from the base of the skull through the vertex without intravenous contrast. COMPARISON:  2017 FINDINGS: Brain: There is no acute intracranial hemorrhage, mass-effect, or edema. Gray-white differentiation is preserved. There is no extra-axial fluid collection. Ventricles and sulci are within normal limits in size and configuration. Vascular: No hyperdense vessel or unexpected calcification. Skull: Calvarium is unremarkable. Sinuses/Orbits: No acute finding. Other: None. IMPRESSION: No acute intracranial hemorrhage, mass effect, or edema. Electronically Signed   By: Guadlupe Spanish M.D.   On: 10/20/2019 15:30    Procedures Procedures (including critical care time)  Medications Ordered in ED Medications  metoCLOPramide (REGLAN) injection 10 mg (has no administration in time range)  diphenhydrAMINE (BENADRYL) injection 12.5 mg (has no administration in time range)  sodium chloride 0.9 % bolus 500 mL (has no administration in time range)  alum & mag hydroxide-simeth (MAALOX/MYLANTA) 200-200-20 MG/5ML suspension 30 mL (has no administration in time range)    And  lidocaine (XYLOCAINE) 2 % viscous mouth solution 15 mL (has no administration in time range)    ED Course  I have reviewed the triage vital signs and the nursing notes.  Pertinent labs & imaging results that were available during my care of the patient were reviewed by me and considered in my medical  decision making (see chart for details).    MDM Rules/Calculators/A&P                     Patient here for evaluation of one month of progressive headache, develop chest pain today. She did over take medications and attempt to treat her headache. She denies any SI but does report being under increased stress recently, recently miscarried in December. She is non-toxic appearing on evaluation with no focal neurologic deficits. Plan to treat her headache. Patient care transferred pending medications, results of her salicylate and acetaminophen lab. Will consult TTS per patient request.  Final Clinical Impression(s) / ED Diagnoses Final diagnoses:  None    Rx / DC Orders ED Discharge Orders    None       Tilden Fossa, MD 10/20/19 1545

## 2019-10-20 NOTE — ED Notes (Signed)
Pt transported to CT ?

## 2019-10-20 NOTE — ED Notes (Signed)
Pt requested to leave AMA due to situation at home. Pt is denying any SI and states she also feels better with little pain after interventions. Pt refused to sign the AMA form, refused to have repeat physical assessment, and refused repeat VS assessment. Pt ambulated out of department well with no complaints.

## 2019-10-22 ENCOUNTER — Encounter: Payer: Self-pay | Admitting: Physician Assistant

## 2019-10-22 ENCOUNTER — Other Ambulatory Visit: Payer: Self-pay

## 2019-10-22 ENCOUNTER — Ambulatory Visit
Admission: EM | Admit: 2019-10-22 | Discharge: 2019-10-22 | Disposition: A | Discharge status: Home / Self Care | Payer: Medicaid Other | Attending: Physician Assistant | Admitting: Physician Assistant

## 2019-10-22 DIAGNOSIS — K0889 Other specified disorders of teeth and supporting structures: Secondary | ICD-10-CM | POA: Diagnosis not present

## 2019-10-22 DIAGNOSIS — R519 Headache, unspecified: Secondary | ICD-10-CM

## 2019-10-22 MED ORDER — DEXAMETHASONE SODIUM PHOSPHATE 10 MG/ML IJ SOLN
10.0000 mg | Freq: Once | INTRAMUSCULAR | Status: AC
  Administered 2019-10-22: 10 mg via INTRAMUSCULAR

## 2019-10-22 MED ORDER — KETOROLAC TROMETHAMINE 30 MG/ML IJ SOLN
30.0000 mg | Freq: Once | INTRAMUSCULAR | Status: AC
  Administered 2019-10-22: 30 mg via INTRAMUSCULAR

## 2019-10-22 MED ORDER — AMOXICILLIN-POT CLAVULANATE 875-125 MG PO TABS: 1.0000 | ORAL_TABLET | Freq: Two times a day (BID) | ORAL | 0 refills | Status: DC

## 2019-10-22 MED ORDER — METOCLOPRAMIDE HCL 5 MG/ML IJ SOLN
5.0000 mg | Freq: Once | INTRAMUSCULAR | Status: AC
  Administered 2019-10-22: 5 mg via INTRAMUSCULAR

## 2019-10-22 MED ORDER — CHLORHEXIDINE GLUCONATE 0.12 % MT SOLN: 10.0000 mL | Freq: Two times a day (BID) | OROMUCOSAL | 0 refills | Status: DC

## 2019-10-22 NOTE — Discharge Instructions (Signed)
Start Augmentin as directed for dental infection. Peridex for dental hygiene. Follow up with dentist for further treatment and evaluation. If experiencing swelling of the throat, trouble breathing, trouble swallowing, leaning forward to breath, drooling, go to the emergency department for further evaluation.   Toradol, reglan, decadron injection in office today for headache. Otherwise, please follow up with PCP/neurology for further evaluation of frequent headaches.

## 2019-10-22 NOTE — ED Triage Notes (Signed)
Pt here for frontal HA x 1 month and left sided dental pain x 3 days; noted increased BP provider notified

## 2019-10-22 NOTE — ED Provider Notes (Signed)
[facility]    CSN: [id number] Arrival date & time: 10/22/19  0806      History   Chief Complaint Chief Complaint  Patient presents with  . Headache  . Dental Pain    HPI Sheryl Monroe is a 24 y.o. female.   24 year old female comes in for dental pain and headache. She was seen for the same 2 days ago at the emergency department.  States left-sided dental pain has been intermittent for the past few months, and became more constant 2 to 3 days ago.  She is unsure of dental pain is associated with a headache.  Work-up done in the ED included CT scan, medications, but patient left AMA.  States medication given at the ED helped aborted headache at the time, but returned.  She denies photophobia, phonophobia, nausea, vomiting.  Denies URI symptoms such as cough, congestion, sore throat.  Denies fever, chills, night sweats.  Denies oral swelling, tripoding, drooling, trismus.      Past Medical History:  Diagnosis Date  . Bacterial vaginosis   . Chlamydia   . Gestational diabetes    with first pregnancy  . Morbid obesity with BMI of 50.0-59.9, adult [facility])     Patient Active Problem List   Diagnosis Date Noted  . Postpartum care following cesarean delivery 09/20/2018  . Encounter for elective induction of labor 09/17/2018  . Social isolation 09/01/2013    Past Surgical History:  Procedure Laterality Date  . CESAREAN SECTION WITH BILATERAL TUBAL LIGATION N/A 09/18/2018   Procedure: CESAREAN SECTION WITH BILATERAL TUBAL LIGATION;  Surgeon: Malachy Mood, MD;  Location: [facility] ORS;  Service: Obstetrics;  Laterality: N/A;  . NO PAST SURGERIES      OB History    Gravida  5   Para  5   Term  5   Preterm  0   AB  0   Living  5     SAB  0   TAB  0   Ectopic  0   Multiple  0   Live Births  5            Home Medications    Prior to Admission medications   Medication Sig Start Date End Date Taking? Authorizing Provider   amoxicillin-clavulanate (AUGMENTIN) 875-125 MG tablet Take 1 tablet by mouth every 12 (twelve) hours. 10/22/19   Tasia Catchings, Mikalia Fessel V, PA-C  chlorhexidine (PERIDEX) 0.12 % solution Use as directed 10 mLs in the mouth or throat 2 (two) times daily. 10/22/19   Ok Edwards, PA-C  Prenatal Vit-Fe Fumarate-FA (PRENATAL MULTIVITAMIN) TABS tablet Take 1 tablet by mouth daily at 12 noon.    [provider]  silver sulfADIAZINE (SILVADENE) 1 % cream Apply 1 application topically daily. Patient not taking: Reported on 10/22/2019 09/28/18   Malachy Mood, MD    Family History Family History  Problem Relation Age of Onset  . Hypertension Father   . Diabetes Paternal Grandmother   . Hypertension Paternal Grandmother   . Hypertension Maternal Grandmother   . Breast cancer Maternal Grandmother   . Hypertension Paternal Grandfather   . Diabetes Paternal Grandfather   . Hypothyroidism Maternal Aunt     Social History Social History   Tobacco Use  . Smoking status: Never Smoker  . Smokeless tobacco: Never Used  Substance Use Topics  . Alcohol use: No  . Drug use: No     Allergies   Patient has no known allergies.   Review  of Systems Review of Systems  Reason unable to perform ROS: See HPI as above.     Physical Exam Triage Vital Signs ED Triage Vitals  Enc Vitals Group     BP 10/22/19 0819 (!) 166/112     Pulse Rate 10/22/19 0819 89     Resp 10/22/19 0819 18     Temp 10/22/19 0819 98.2 F (36.8 C)     Temp Source 10/22/19 0819 Oral     SpO2 10/22/19 0819 98 %     Weight --      Height --      Head Circumference --      Peak Flow --      Pain Score 10/22/19 0820 9     Pain Loc --      Pain Edu? --      Excl. in GC? --    No data found.  Updated Vital Signs BP (!) 166/112 (BP Location: Left Arm)   Pulse 89   Temp 98.2 F (36.8 C) (Oral)   Resp 18   LMP 09/29/2019 (Approximate) Comment: patient reports no chance pregnancy 10/20/2019  SpO2 98%   Physical Exam  Constitutional:      General: She is not in acute distress.    Appearance: Normal appearance. She is well-developed. She is not ill-appearing, toxic-appearing or diaphoretic.  HENT:     Head: Normocephalic and atraumatic.     Right Ear: Tympanic membrane, ear canal and external ear normal. Tympanic membrane is not erythematous or bulging.     Left Ear: Tympanic membrane, ear canal and external ear normal. Tympanic membrane is not erythematous or bulging.     Nose:     Right Sinus: No maxillary sinus tenderness or frontal sinus tenderness.     Left Sinus: No maxillary sinus tenderness or frontal sinus tenderness.     Mouth/Throat:     Mouth: Mucous membranes are moist.     Pharynx: Oropharynx is clear. Uvula midline.      Comments: Gum swelling around broken tooth. Tenderness to palpation. No fluctuance.  Floor of mouth soft to palpation.  No facial swelling. Eyes:     Conjunctiva/sclera: Conjunctivae normal.     Pupils: Pupils are equal, round, and reactive to light.  Cardiovascular:     Rate and Rhythm: Normal rate and regular rhythm.     Heart sounds: Normal heart sounds. No murmur. No friction rub. No gallop.   Pulmonary:     Effort: Pulmonary effort is normal. No accessory muscle usage, prolonged expiration, respiratory distress or retractions.     Comments: Speaking in full sentences without difficulty Musculoskeletal:     Cervical back: Normal range of motion and neck supple.  Skin:    General: Skin is warm and dry.  Neurological:     General: No focal deficit present.     Mental Status: She is alert and oriented to person, place, and time.     GCS: GCS eye subscore is 4. GCS verbal subscore is 5. GCS motor subscore is 6.     Comments: Able to ambulate on own without difficulty. Normal gait.       UC Treatments / Results  Labs (all labs ordered are listed, but only abnormal results are displayed) Labs Reviewed - No data to display  EKG   Radiology No results  found.  Procedures Procedures (including critical care time)  Medications Ordered in UC Medications  ketorolac (TORADOL) 30 MG/ML injection 30 mg (30 mg Intramuscular  Given 10/22/19 0841)  metoCLOPramide (REGLAN) injection 5 mg (5 mg Intramuscular Given 10/22/19 0841)  dexamethasone (DECADRON) injection 10 mg (10 mg Intramuscular Given 10/22/19 0841)    Initial Impression / Assessment and Plan / UC Course  I have reviewed the triage vital signs and the nursing notes.  Pertinent labs & imaging results that were available during my care of the patient were reviewed by me and considered in my medical decision making (see chart for details).    CT scan performed 2 days ago unremarkable.  Will provide headache cocktail with Toradol, Reglan, Decadron.  Will cover for dental infection with Augmentin.  Otherwise, discussed return precautions.  Follow-up with dentist for further evaluation of dental pain.  If headaches continue without much resolution, to follow-up with PCP/neurology for further evaluation.  Patient expresses understanding and agrees to plan.  Final Clinical Impressions(s) / UC Diagnoses   Final diagnoses:  Pain, dental  Recurrent headache    ED Prescriptions    Medication Sig Dispense Auth. Provider   amoxicillin-clavulanate (AUGMENTIN) 875-125 MG tablet Take 1 tablet by mouth every 12 (twelve) hours. 14 tablet Virl Coble V, PA-C   chlorhexidine (PERIDEX) 0.12 % solution Use as directed 10 mLs in the mouth or throat 2 (two) times daily. 120 mL Cathie Hoops, Judianne Seiple V, PA-C     I have reviewed the PDMP during this encounter.   Belinda Fisher, PA-C 10/22/19 1655

## 2020-04-26 ENCOUNTER — Other Ambulatory Visit: Payer: Self-pay

## 2020-04-26 ENCOUNTER — Ambulatory Visit
Admission: EM | Admit: 2020-04-26 | Discharge: 2020-04-26 | Disposition: A | Discharge status: Home / Self Care | Payer: Medicaid Other | Attending: Emergency Medicine | Admitting: Emergency Medicine

## 2020-04-26 ENCOUNTER — Encounter: Payer: Self-pay | Admitting: Emergency Medicine

## 2020-04-26 DIAGNOSIS — N3000 Acute cystitis without hematuria: Secondary | ICD-10-CM | POA: Insufficient documentation

## 2020-04-26 LAB — POCT URINALYSIS DIP (MANUAL ENTRY)
Bilirubin, UA: NEGATIVE
Glucose, UA: NEGATIVE mg/dL
Ketones, POC UA: NEGATIVE mg/dL
Nitrite, UA: NEGATIVE
Protein Ur, POC: 30 mg/dL — AB
Spec Grav, UA: >=1.03 — AB (ref 1.010–1.025)
Urobilinogen, UA: 0.2 E.U./dL
pH, UA: 5.5 (ref 5.0–8.0)

## 2020-04-26 LAB — POCT URINE PREGNANCY: Preg Test, Ur: NEGATIVE

## 2020-04-26 MED ORDER — CEPHALEXIN 500 MG PO CAPS: 500.0000 mg | ORAL_CAPSULE | Freq: Two times a day (BID) | ORAL | 0 refills | Status: AC

## 2020-04-26 NOTE — Discharge Instructions (Signed)
Take antibiotic twice daily with food. Important to drink plenty of water throughout the day. May continue Azo as needed for burning sensation. Return for worsening urinary symptoms, blood in urine, abdominal or back pain, fever. 

## 2020-04-26 NOTE — ED Provider Notes (Signed)
[facility]    CSN: [id number] Arrival date & time: 04/26/20  1532      History   Chief Complaint Chief Complaint  Patient presents with  . Abdominal Pain    HPI Sheryl Monroe is a 24 y.o. female  Presenting for possible UTI.  Endorsing lower abdominal pain, burning sensation when urinating.  No pelvic or vaginal pain, discharge, pruritus.  Denies nausea, vomiting, back pain, fever.  Denies history of frequent UTIs.  Requesting STI testing: Currently sexual active with 1 female partner, not routinely using condoms.  Past Medical History:  Diagnosis Date  . Bacterial vaginosis   . Chlamydia   . Gestational diabetes    with first pregnancy  . Morbid obesity with BMI of 50.0-59.9, adult [facility])     Patient Active Problem List   Diagnosis Date Noted  . Postpartum care following cesarean delivery 09/20/2018  . Encounter for elective induction of labor 09/17/2018  . Social isolation 09/01/2013    Past Surgical History:  Procedure Laterality Date  . CESAREAN SECTION WITH BILATERAL TUBAL LIGATION N/A 09/18/2018   Procedure: CESAREAN SECTION WITH BILATERAL TUBAL LIGATION;  Surgeon: Vena Austria, MD;  Location: [facility] ORS;  Service: Obstetrics;  Laterality: N/A;  . NO PAST SURGERIES      OB History    Gravida  5   Para  5   Term  5   Preterm  0   AB  0   Living  5     SAB  0   TAB  0   Ectopic  0   Multiple  0   Live Births  5            Home Medications    Prior to Admission medications   Medication Sig Start Date End Date Taking? Authorizing Provider  cephALEXin (KEFLEX) 500 MG capsule Take 1 capsule (500 mg total) by mouth 2 (two) times daily for 3 days. 04/26/20 04/29/20  Hall-Potvin, [place], PA-C  Prenatal Vit-Fe Fumarate-FA (PRENATAL MULTIVITAMIN) TABS tablet Take 1 tablet by mouth daily at 12 noon.    [provider]    Family History Family History  Problem Relation Age of Onset  . Hypertension Father   .  Diabetes Paternal Grandmother   . Hypertension Paternal Grandmother   . Hypertension Maternal Grandmother   . Breast cancer Maternal Grandmother   . Hypertension Paternal Grandfather   . Diabetes Paternal Grandfather   . Hypothyroidism Maternal Aunt     Social History Social History   Tobacco Use  . Smoking status: Never Smoker  . Smokeless tobacco: Never Used  Substance Use Topics  . Alcohol use: No  . Drug use: No     Allergies   Patient has no known allergies.   Review of Systems As per HPI   Physical Exam Triage Vital Signs ED Triage Vitals  Enc Vitals Group     BP 04/26/20 1546 (!) 160/99     Pulse Rate 04/26/20 1546 99     Resp 04/26/20 1546 18     Temp 04/26/20 1546 98.5 F (36.9 C)     Temp Source 04/26/20 1546 Oral     SpO2 04/26/20 1546 97 %     Weight --      Height --      Head Circumference --      Peak Flow --      Pain Score 04/26/20 1547 5     Pain Loc --  Pain Edu? --      Excl. in GC? --    No data found.  Updated Vital Signs BP (!) 160/99 (BP Location: Left Arm)   Pulse 99   Temp 98.5 F (36.9 C) (Oral)   Resp 18   SpO2 97%   Visual Acuity Right Eye Distance:   Left Eye Distance:   Bilateral Distance:    Right Eye Near:   Left Eye Near:    Bilateral Near:     Physical Exam Constitutional:      General: She is not in acute distress. HENT:     Head: Normocephalic and atraumatic.  Eyes:     General: No scleral icterus.    Pupils: Pupils are equal, round, and reactive to light.  Cardiovascular:     Rate and Rhythm: Normal rate.  Pulmonary:     Effort: Pulmonary effort is normal.  Abdominal:     General: Bowel sounds are normal.     Palpations: Abdomen is soft.     Tenderness: There is no abdominal tenderness. There is no right CVA tenderness, left CVA tenderness or guarding.  Genitourinary:    Comments: Patient declined, self-swab performed Skin:    Coloration: Skin is not jaundiced or pale.  Neurological:      Mental Status: She is alert and oriented to person, place, and time.      UC Treatments / Results  Labs (all labs ordered are listed, but only abnormal results are displayed) Labs Reviewed  POCT URINALYSIS DIP (MANUAL ENTRY) - Abnormal; Notable for the following components:      Result Value   Spec Grav, UA >=1.030 (*)    Blood, UA large (*)    Protein Ur, POC =30 (*)    Leukocytes, UA Small (1+) (*)    All other components within normal limits  URINE CULTURE  POCT URINE PREGNANCY  CERVICOVAGINAL ANCILLARY ONLY    EKG   Radiology No results found.  Procedures Procedures (including critical care time)  Medications Ordered in UC Medications - No data to display  Initial Impression / Assessment and Plan / UC Course  I have reviewed the triage vital signs and the nursing notes.  Pertinent labs & imaging results that were available during my care of the patient were reviewed by me and considered in my medical decision making (see chart for details).     Pregnancy negative, urine with leukocytes, protein, blood, elevated specific gravity.  Culture pending.  Will cover for UTI with Keflex.  Cytology pending: We will treat if indicated.  Return precautions discussed, pt verbalized understanding and is agreeable to plan. Final Clinical Impressions(s) / UC Diagnoses   Final diagnoses:  Acute cystitis without hematuria     Discharge Instructions     Take antibiotic twice daily with food. Important to drink plenty of water throughout the day. May continue Azo as needed for burning sensation. Return for worsening urinary symptoms, blood in urine, abdominal or back pain, fever.    ED Prescriptions    Medication Sig Dispense Auth. Provider   cephALEXin (KEFLEX) 500 MG capsule Take 1 capsule (500 mg total) by mouth 2 (two) times daily for 3 days. 6 capsule Hall-Potvin, [place], PA-C     PDMP not reviewed this encounter.   Hall-Potvin, [place], [place] 04/26/20 1609

## 2020-04-26 NOTE — ED Triage Notes (Signed)
Pt sts lower abd pain that she thinks is UTI but also requesting STD testing

## 2020-04-30 LAB — CERVICOVAGINAL ANCILLARY ONLY
Chlamydia: NEGATIVE
Comment: NEGATIVE
Comment: NEGATIVE
Comment: NORMAL
Neisseria Gonorrhea: NEGATIVE
Trichomonas: POSITIVE — AB

## 2020-05-01 LAB — URINE CULTURE: Culture: >=100000 — AB

## 2020-05-29 ENCOUNTER — Other Ambulatory Visit: Payer: Self-pay

## 2020-05-29 ENCOUNTER — Emergency Department (HOSPITAL_COMMUNITY)
Admission: EM | Admit: 2020-05-29 | Discharge: 2020-05-29 | Discharge status: Against Medical Advice | Payer: Medicaid Other | Attending: Emergency Medicine | Admitting: Emergency Medicine

## 2020-05-29 ENCOUNTER — Emergency Department (HOSPITAL_COMMUNITY): Payer: Medicaid Other

## 2020-05-29 DIAGNOSIS — Z765 Malingerer [conscious simulation]: Secondary | ICD-10-CM | POA: Diagnosis not present

## 2020-05-29 DIAGNOSIS — Y9389 Activity, other specified: Secondary | ICD-10-CM | POA: Insufficient documentation

## 2020-05-29 DIAGNOSIS — Y92524 Gas station as the place of occurrence of the external cause: Secondary | ICD-10-CM | POA: Insufficient documentation

## 2020-05-29 DIAGNOSIS — S71132A Puncture wound without foreign body, left thigh, initial encounter: Secondary | ICD-10-CM | POA: Diagnosis present
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
## 2020-05-29 NOTE — ED Notes (Signed)
Pt reports that she needs to go get her kids from daycare. She reports that her family is going to take them somewhere safe to stay. Pt refused D/C VS. Pt was ambulatory w/ steady gait and in NAD upon departure. Notified PA.
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
## 2020-08-20 ENCOUNTER — Encounter (HOSPITAL_COMMUNITY): Payer: Self-pay

## 2020-08-20 ENCOUNTER — Ambulatory Visit (HOSPITAL_COMMUNITY)
Admission: EM | Admit: 2020-08-20 | Discharge: 2020-08-20 | Disposition: A | Discharge status: Home / Self Care | Payer: Medicaid Other | Attending: Student | Admitting: Student

## 2020-08-20 DIAGNOSIS — S025XXB Fracture of tooth (traumatic), initial encounter for open fracture: Secondary | ICD-10-CM

## 2020-08-20 DIAGNOSIS — K0889 Other specified disorders of teeth and supporting structures: Secondary | ICD-10-CM

## 2020-08-20 DIAGNOSIS — I161 Hypertensive emergency: Secondary | ICD-10-CM | POA: Diagnosis not present

## 2020-08-20 MED ORDER — KETOROLAC TROMETHAMINE 60 MG/2ML IM SOLN
60.0000 mg | Freq: Once | INTRAMUSCULAR | Status: AC
  Administered 2020-08-20: 60 mg via INTRAMUSCULAR

## 2020-08-20 MED ORDER — KETOROLAC TROMETHAMINE 60 MG/2ML IM SOLN
INTRAMUSCULAR | Status: AC
  Filled 2020-08-20: qty 2

## 2020-08-20 MED ORDER — LIDOCAINE VISCOUS HCL 2 % MT SOLN: 15.0000 mL | OROMUCOSAL | 0 refills | Status: DC | PRN

## 2020-08-20 NOTE — ED Provider Notes (Signed)
[facility]    CSN: [id number] Arrival date & time: 08/20/20  1052      History   Chief Complaint Chief Complaint  Patient presents with  . Dental Pain    HPI Sheryl Monroe is a 24 y.o. female Pt presents with dental pain x 1 month; headache and blurry vision right eye x 1 week. History of morbid obesity. -pt states that she's had dental pain for 1 month, since her tooth was cracked. Describes the pain as 10/10 and in the front of her mouth. Pain radiates across her jaw. She has an appointment with dentist in 3 weeks on 09/12/2019. Denies fevers/chills, denies swelling in mouth/lips. Tylenol/ibuprofen provide little relief. -states she has had 1 week of 8/10 headaches and blurred vision in right eye. Vision sometimes improves if she lays down and turns off the light. Endorses photophobia. Denies phonophobia, n/v/d, weakness or sensation changes in arms/legs, chest pain/pressure, shortness of breath. Denies injury to the eyes. Has neve been on medication for high blood pressure. She doesn't wear contacts or glasses    HPI  Past Medical History:  Diagnosis Date  . Bacterial vaginosis   . Chlamydia   . Gestational diabetes    with first pregnancy  . Morbid obesity with BMI of 50.0-59.9, adult [facility])     Patient Active Problem List   Diagnosis Date Noted  . Postpartum care following cesarean delivery 09/20/2018  . Encounter for elective induction of labor 09/17/2018  . Social isolation 09/01/2013    Past Surgical History:  Procedure Laterality Date  . CESAREAN SECTION WITH BILATERAL TUBAL LIGATION N/A 09/18/2018   Procedure: CESAREAN SECTION WITH BILATERAL TUBAL LIGATION;  Surgeon: Vena Austria, MD;  Location: [facility] ORS;  Service: Obstetrics;  Laterality: N/A;  . NO PAST SURGERIES      OB History    Gravida  5   Para  5   Term  5   Preterm  0   AB  0   Living  5     SAB  0   IAB  0   Ectopic  0   Multiple  0   Live Births  5             Home Medications    Prior to Admission medications   Medication Sig Start Date End Date Taking? Authorizing Provider  lidocaine (XYLOCAINE) 2 % solution Use as directed 15 mLs in the mouth or throat as needed for mouth pain. 08/20/20   Rhys Martini, PA-C  Prenatal Vit-Fe Fumarate-FA (PRENATAL MULTIVITAMIN) TABS tablet Take 1 tablet by mouth daily at 12 noon.    [provider]    Family History Family History  Problem Relation Age of Onset  . Hypertension Father   . Diabetes Paternal Grandmother   . Hypertension Paternal Grandmother   . Hypertension Maternal Grandmother   . Breast cancer Maternal Grandmother   . Hypertension Paternal Grandfather   . Diabetes Paternal Grandfather   . Hypothyroidism Maternal Aunt     Social History Social History   Tobacco Use  . Smoking status: Never Smoker  . Smokeless tobacco: Never Used  Substance Use Topics  . Alcohol use: No  . Drug use: No     Allergies   Patient has no known allergies.   Review of Systems Review of Systems  HENT: Positive for dental problem.   Eyes: Positive for visual disturbance.  Neurological: Positive for headaches.  All other systems reviewed and are  negative.    Physical Exam Triage Vital Signs ED Triage Vitals  Enc Vitals Group     BP 08/20/20 1145 (!) 173/106     Pulse Rate 08/20/20 1145 84     Resp 08/20/20 1145 20     Temp 08/20/20 1145 99.1 F (37.3 C)     Temp Source 08/20/20 1145 Oral     SpO2 08/20/20 1145 99 %     Weight --      Height --      Head Circumference --      Peak Flow --      Pain Score 08/20/20 1143 10     Pain Loc --      Pain Edu? --      Excl. in GC? --    No data found.  Updated Vital Signs BP (!) 148/124 (BP Location: Right Wrist)   Pulse 84   Temp 99.1 F (37.3 C) (Oral)   Resp 20   LMP  (Within Weeks) Comment: 2 weeks  SpO2 99%   Visual Acuity Right Eye Distance: 20/25 (without correction) Left Eye Distance: 20/25 (without  correction) Bilateral Distance: 20/25 (without correction)  Right Eye Near:   Left Eye Near:    Bilateral Near:     Physical Exam Vitals reviewed.  Constitutional:      General: She is not in acute distress.    Appearance: Normal appearance. She is obese. She is not ill-appearing.  HENT:     Head: Normocephalic and atraumatic.     Jaw: No tenderness, swelling, pain on movement or malocclusion.     Salivary Glands: Right salivary gland is not diffusely enlarged or tender. Left salivary gland is not diffusely enlarged or tender.     Right Ear: Hearing, tympanic membrane, ear canal and external ear normal. No decreased hearing noted. No drainage, swelling or tenderness. No middle ear effusion. There is no impacted cerumen. No foreign body. No mastoid tenderness. Tympanic membrane is not scarred, perforated, erythematous, retracted or bulging.     Left Ear: Hearing, tympanic membrane, ear canal and external ear normal. No decreased hearing noted. No drainage, swelling or tenderness.  No middle ear effusion. There is no impacted cerumen. No foreign body. No mastoid tenderness. Tympanic membrane is not scarred, perforated, erythematous, retracted or bulging.     Nose: Nose normal.     Mouth/Throat:     Lips: Pink.     Mouth: Mucous membranes are moist. No injury, lacerations, oral lesions or angioedema.     Dentition: Does not have dentures. Dental tenderness and dental caries present. No gingival swelling, dental abscesses or gum lesions.     Tongue: No lesions.     Palate: No mass and lesions.     Pharynx: No pharyngeal swelling, oropharyngeal exudate, posterior oropharyngeal erythema or uvula swelling.     Tonsils: No tonsillar exudate or tonsillar abscesses.     Comments: Poor dentician. Right lower incisor is cracked. No swelling, warmth, erythema Eyes:     General: Lids are normal. Vision grossly intact. Gaze aligned appropriately. No visual field deficit.       Right eye: No foreign  body, discharge or hordeolum.        Left eye: No foreign body, discharge or hordeolum.     Extraocular Movements: Extraocular movements intact.     Right eye: No nystagmus.     Left eye: No nystagmus.     Conjunctiva/sclera:     Right eye: Right conjunctiva is  not injected. No chemosis, exudate or hemorrhage.    Left eye: Left conjunctiva is not injected. No chemosis, exudate or hemorrhage.    Pupils: Pupils are equal, round, and reactive to light. Pupils are equal.     Visual Fields: Right eye visual fields normal and left eye visual fields normal.     Comments: PERRLA. EOMI without pain. No proptosis.  Anterior chamber, conjunctivae, sclera all without hemorrhage or visible injury  Cardiovascular:     Rate and Rhythm: Normal rate and regular rhythm.     Heart sounds: Normal heart sounds.  Pulmonary:     Effort: Pulmonary effort is normal.     Breath sounds: Normal breath sounds and air entry. No wheezing, rhonchi or rales.  Musculoskeletal:     Cervical back: Normal range of motion and neck supple. No rigidity.  Lymphadenopathy:     Cervical: No cervical adenopathy.  Neurological:     General: No focal deficit present.     Mental Status: She is alert and oriented to person, place, and time. Mental status is at baseline.     Cranial Nerves: Cranial nerves are intact. No cranial nerve deficit or facial asymmetry.     Sensory: Sensation is intact. No sensory deficit.     Motor: Motor function is intact. No weakness.     Coordination: Coordination is intact. Coordination normal.     Gait: Gait is intact. Gait normal.     Comments: CN 2-12 intact. No weakness or numbness in UEs or LEs.  Psychiatric:        Attention and Perception: Attention and perception normal.        Mood and Affect: Mood and affect normal.        Behavior: Behavior normal.        Thought Content: Thought content normal.        Judgment: Judgment normal.      UC Treatments / Results  Labs (all labs  ordered are listed, but only abnormal results are displayed) Labs Reviewed - No data to display  EKG   Radiology No results found.  Procedures Procedures (including critical care time)  Medications Ordered in UC Medications  ketorolac (TORADOL) injection 60 mg (60 mg Intramuscular Given 08/20/20 1245)    Initial Impression / Assessment and Plan / UC Course  I have reviewed the triage vital signs and the nursing notes.  Pertinent labs & imaging results that were available during my care of the patient were reviewed by me and considered in my medical decision making (see chart for details).    Visual Acuity  Right Eye Distance: 20/25 (without correction) Left Eye Distance: 20/25 (without correction) Bilateral Distance: 20/25 (without correction)  Right Eye Near:   Left Eye Near:    Bilateral Near:         -For dental pain: viscous lidocaine sent as below. toradol injection administered today (pt is not pregnant). She has an appointment with dentist on 09/11/20. No signs of infection at today's appointment. -For hypertensive emergency: BP initially 173/106; on repeat was 148/124. Pt currently endorses 8/10 headache and blurred vision, meeting criteria for hypertensive emergency (vision is 20/25 bilaterally as above; pt does not wear contacts or glasses). Head straight to ER. Pt is stable to transport herself. She understands if she develops chest pain, shortness of breath, weakness in arms/legs, etc- on the way to the ER, she must immediately call EMS.  Final Clinical Impressions(s) / UC Diagnoses   Final diagnoses:  Hypertensive  emergency  Pain, dental  Open fracture of tooth, initial encounter  Tooth pain     Discharge Instructions     [facility] high blood pressure and headaches. If you experience any chest pain, shortness of breath, worsening of headaches, etc on the way to the ER, call EMS immediately.    ED  Prescriptions    Medication Sig Dispense Auth. Provider   lidocaine (XYLOCAINE) 2 % solution Use as directed 15 mLs in the mouth or throat as needed for mouth pain. 100 mL Rhys MartiniGraham, Liandro Thelin E, PA-C     PDMP not reviewed this encounter.   Rhys MartiniGraham, Caedon Bond E, PA-C 08/20/20 1417

## 2020-08-20 NOTE — ED Notes (Signed)
BP reported to Honaker PA.

## 2020-08-20 NOTE — ED Triage Notes (Addendum)
Pt presents with dental pain x 1 month; headache and blurry vision right eye x 1 week. States 1 of the tooth cracked 1 week ago. Dentist appt 1/2//2021.

## 2020-08-20 NOTE — Discharge Instructions (Addendum)
Head straight to [facility] ER for further evaluation of high blood pressure and headaches. If you experience any chest pain, shortness of breath, worsening of headaches, etc on the way to the ER, call EMS immediately.

## 2020-11-25 ENCOUNTER — Encounter (HOSPITAL_COMMUNITY): Payer: Self-pay

## 2020-11-25 ENCOUNTER — Other Ambulatory Visit: Payer: Self-pay

## 2020-11-25 ENCOUNTER — Ambulatory Visit (HOSPITAL_COMMUNITY)
Admission: EM | Admit: 2020-11-25 | Discharge: 2020-11-25 | Disposition: A | Discharge status: Home / Self Care | Payer: Medicaid Other

## 2020-11-25 DIAGNOSIS — L02411 Cutaneous abscess of right axilla: Secondary | ICD-10-CM | POA: Diagnosis not present

## 2020-11-25 MED ORDER — SULFAMETHOXAZOLE-TRIMETHOPRIM 800-160 MG PO TABS: 1.0000 | ORAL_TABLET | Freq: Two times a day (BID) | ORAL | 0 refills | Status: AC

## 2020-11-25 NOTE — ED Triage Notes (Signed)
Pt in with c/o abscess under left arm that she noticed a few days ago  States the pain makes her arm go numb and she has a burning sensation  Denies any drainage

## 2020-11-25 NOTE — ED Provider Notes (Signed)
[facility]    CSN: [id number] Arrival date & time: 11/25/20  1754      History   Chief Complaint Chief Complaint  Patient presents with  . Abscess    HPI Sheryl Monroe is a 25 y.o. female.   Sheryl Monroe is a 25 y.o. here with chief complaint of left axilla pain.  Reports developed an abscess and was seen at another facility for same.  Was told to not use deodorant and use warm compress.  Reports swelling and pain have gotten worse over the past few days.  Denies any specific alleviating or aggravating factors.  Denies any fevers, chest pain, shortness of breath, N/V/D, numbness, tingling, weakness, abdominal pain, or headaches.   ROS: As per HPI, all other pertinent ROS negative    The history is provided by the patient.    Past Medical History:  Diagnosis Date  . Bacterial vaginosis   . Chlamydia   . Gestational diabetes    with first pregnancy  . Morbid obesity with BMI of 50.0-59.9, adult [facility])     Patient Active Problem List   Diagnosis Date Noted  . Postpartum care following cesarean delivery 09/20/2018  . Encounter for elective induction of labor 09/17/2018  . Social isolation 09/01/2013    Past Surgical History:  Procedure Laterality Date  . CESAREAN SECTION WITH BILATERAL TUBAL LIGATION N/A 09/18/2018   Procedure: CESAREAN SECTION WITH BILATERAL TUBAL LIGATION;  Surgeon: Vena Austria, MD;  Location: [facility] ORS;  Service: Obstetrics;  Laterality: N/A;  . NO PAST SURGERIES      OB History    Gravida  5   Para  5   Term  5   Preterm  0   AB  0   Living  5     SAB  0   IAB  0   Ectopic  0   Multiple  0   Live Births  5            Home Medications    Prior to Admission medications   Medication Sig Start Date End Date Taking? Authorizing Provider  buPROPion (WELLBUTRIN XL) 150 MG 24 hr tablet 1 tablet in the morning 11/14/20  Yes [provider]  ergocalciferol (VITAMIN D2) 1.25 MG (50000 UT) capsule 1  capsule 11/15/20  Yes [provider]  sulfamethoxazole-trimethoprim (BACTRIM DS) 800-160 MG tablet Take 1 tablet by mouth 2 (two) times daily for 7 days. 11/25/20 12/02/20 Yes Ivette Loyal, NP  lidocaine (XYLOCAINE) 2 % solution Use as directed 15 mLs in the mouth or throat as needed for mouth pain. 08/20/20   Rhys Martini, PA-C  Prenatal Vit-Fe Fumarate-FA (PRENATAL MULTIVITAMIN) TABS tablet Take 1 tablet by mouth daily at 12 noon.    [provider]    Family History Family History  Problem Relation Age of Onset  . Hypertension Father   . Diabetes Paternal Grandmother   . Hypertension Paternal Grandmother   . Hypertension Maternal Grandmother   . Breast cancer Maternal Grandmother   . Hypertension Paternal Grandfather   . Diabetes Paternal Grandfather   . Hypothyroidism Maternal Aunt     Social History Social History   Tobacco Use  . Smoking status: Never Smoker  . Smokeless tobacco: Never Used  Substance Use Topics  . Alcohol use: No  . Drug use: No     Allergies   Patient has no known allergies.   Review of Systems Review of Systems  Skin:  Positive for wound.  All other systems reviewed and are negative.    Physical Exam Triage Vital Signs ED Triage Vitals  Enc Vitals Group     BP 11/25/20 1823 (!) 133/95     Pulse Rate 11/25/20 1823 100     Resp 11/25/20 1823 20     Temp 11/25/20 1823 98.9 F (37.2 C)     Temp src --      SpO2 11/25/20 1823 95 %     Weight --      Height --      Head Circumference --      Peak Flow --      Pain Score 11/25/20 1821 10     Pain Loc --      Pain Edu? --      Excl. in GC? --    No data found.  Updated Vital Signs BP (!) 133/95   Pulse 100   Temp 98.9 F (37.2 C)   Resp 20   LMP 11/21/2020 (Approximate)   SpO2 95%   Breastfeeding No   Visual Acuity Right Eye Distance:   Left Eye Distance:   Bilateral Distance:    Right Eye Near:   Left Eye Near:    Bilateral Near:     Physical  Exam Vitals and nursing note reviewed.  Constitutional:      General: She is not in acute distress.    Appearance: Normal appearance. She is not ill-appearing, toxic-appearing or diaphoretic.  HENT:     Head: Normocephalic and atraumatic.  Eyes:     Conjunctiva/sclera: Conjunctivae normal.  Cardiovascular:     Rate and Rhythm: Normal rate.     Pulses: Normal pulses.  Pulmonary:     Effort: Pulmonary effort is normal.  Abdominal:     General: Abdomen is flat.  Musculoskeletal:        General: Normal range of motion.     Cervical back: Normal range of motion.  Skin:    General: Skin is warm and dry.     Findings: Abscess (abscess to right axilla that is draining, denies any pain or tenderness to right axilla.  left axilla with 2-3cm abscess, fluctuant and painful) present.  Neurological:     General: No focal deficit present.     Mental Status: She is alert and oriented to person, place, and time.  Psychiatric:        Mood and Affect: Mood normal.      UC Treatments / Results  Labs (all labs ordered are listed, but only abnormal results are displayed) Labs Reviewed - No data to display  EKG   Radiology No results found.  Procedures Incision and Drainage  Date/Time: 11/25/2020 7:02 PM Performed by: Ivette Loyal, NP Authorized by: Ivette Loyal, NP   Consent:    Consent obtained:  Verbal   Consent given by:  Patient   Risks, benefits, and alternatives were discussed: yes     Risks discussed:  Bleeding, incomplete drainage and pain   Alternatives discussed:  No treatment Universal protocol:    Patient identity confirmed:  Verbally with patient and arm band Location:    Type:  Abscess   Size:  2-3cm   Location:  Upper extremity (left axilla) Pre-procedure details:    Skin preparation:  Antiseptic wash and povidone-iodine Anesthesia:    Anesthesia method:  Local infiltration   Local anesthetic:  Lidocaine 1% WITH epi Procedure type:    Complexity:   Simple Procedure details:  Incision types:  Single straight   Incision depth:  Dermal   Drainage:  Purulent   Drainage amount:  Copious   Wound treatment:  Wound left open   Packing materials:  None Post-procedure details:    Procedure completion:  Tolerated well, no immediate complications   (including critical care time)  Medications Ordered in UC Medications - No data to display  Initial Impression / Assessment and Plan / UC Course  I have reviewed the triage vital signs and the nursing notes.  Pertinent labs & imaging results that were available during my care of the patient were reviewed by me and considered in my medical decision making (see chart for details).     Abscess of left axilla I&D performed in office as described above. Clean wound with warm soapy water and apply dressing BID or as needed Apply warm compress 3 times a day Bactrim BID x7 days Ibuprofen/Tylenol as needed for pain and fever Discusses signs and symptom of infection.  Follow up as needed.   Final Clinical Impressions(s) / UC Diagnoses   Final diagnoses:  Abscess of axilla, right     Discharge Instructions     Take the Bactrim twice a day for 7 days.  Use a warm compress 3 times a day.   You can take Ibuprofen or Tylenol as needed for pain.   Do not use deodorant while the wound is healing.  Clean the wound with warm soapy water and change the dressing at least twice a day.   Return or go to the Emergency Department if you develop fever, increased pain, redness, warmth, or red streaks.       ED Prescriptions    Medication Sig Dispense Auth. Provider   sulfamethoxazole-trimethoprim (BACTRIM DS) 800-160 MG tablet Take 1 tablet by mouth 2 (two) times daily for 7 days. 14 tablet Ivette Loyal, NP     PDMP not reviewed this encounter.   Ivette Loyal, NP 11/25/20 [phone]

## 2020-11-25 NOTE — Discharge Instructions (Signed)
Take the Bactrim twice a day for 7 days.  Use a warm compress 3 times a day.   You can take Ibuprofen or Tylenol as needed for pain.   Do not use deodorant while the wound is healing.  Clean the wound with warm soapy water and change the dressing at least twice a day.   Return or go to the Emergency Department if you develop fever, increased pain, redness, warmth, or red streaks.

## 2020-12-08 ENCOUNTER — Ambulatory Visit
Admission: EM | Admit: 2020-12-08 | Discharge: 2020-12-08 | Disposition: A | Discharge status: Home / Self Care | Payer: Medicaid Other | Attending: Emergency Medicine | Admitting: Emergency Medicine

## 2020-12-08 ENCOUNTER — Other Ambulatory Visit: Payer: Self-pay

## 2020-12-08 DIAGNOSIS — H6981 Other specified disorders of Eustachian tube, right ear: Secondary | ICD-10-CM | POA: Diagnosis not present

## 2020-12-08 DIAGNOSIS — S161XXA Strain of muscle, fascia and tendon at neck level, initial encounter: Secondary | ICD-10-CM | POA: Diagnosis not present

## 2020-12-08 DIAGNOSIS — R519 Headache, unspecified: Secondary | ICD-10-CM

## 2020-12-08 MED ORDER — CETIRIZINE HCL 10 MG PO CAPS: 10.0000 mg | ORAL_CAPSULE | Freq: Every day | ORAL | 0 refills | Status: DC

## 2020-12-08 MED ORDER — FLUTICASONE PROPIONATE 50 MCG/ACT NA SUSP: 1.0000 | Freq: Every day | NASAL | 0 refills | Status: DC

## 2020-12-08 MED ORDER — IBUPROFEN 800 MG PO TABS: 800.0000 mg | ORAL_TABLET | Freq: Three times a day (TID) | ORAL | 0 refills | Status: DC

## 2020-12-08 MED ORDER — TIZANIDINE HCL 2 MG PO TABS: 2.0000 mg | ORAL_TABLET | Freq: Four times a day (QID) | ORAL | 0 refills | Status: DC | PRN

## 2020-12-08 NOTE — ED Triage Notes (Signed)
Pt presents with right ear pain that began yesterday as well as headache

## 2020-12-08 NOTE — ED Provider Notes (Signed)
[facility]    CSN: [id number] Arrival date & time: 12/08/20  0858      History   Chief Complaint Chief Complaint  Patient presents with  . Otalgia    HPI Sheryl Monroe is a 25 y.o. female presenting today for evaluation of headache and ear pain.  Reports symptoms began yesterday.  Reports a lot of pressure to frontal area with light sensitivity.  Denies any significant congestion drainage or sore throat.  Denies fevers.  Feels as if right ear has fluid on it and is clogged.  Also has had some neck discomfort on right side as well.  Using ibuprofen with some relief of headache.  HPI  Past Medical History:  Diagnosis Date  . Bacterial vaginosis   . Chlamydia   . Gestational diabetes    with first pregnancy  . Morbid obesity with BMI of 50.0-59.9, adult [facility])     Patient Active Problem List   Diagnosis Date Noted  . Postpartum care following cesarean delivery 09/20/2018  . Encounter for elective induction of labor 09/17/2018  . Social isolation 09/01/2013    Past Surgical History:  Procedure Laterality Date  . CESAREAN SECTION WITH BILATERAL TUBAL LIGATION N/A 09/18/2018   Procedure: CESAREAN SECTION WITH BILATERAL TUBAL LIGATION;  Surgeon: Vena Austria, MD;  Location: [facility] ORS;  Service: Obstetrics;  Laterality: N/A;  . NO PAST SURGERIES      OB History    Gravida  5   Para  5   Term  5   Preterm  0   AB  0   Living  5     SAB  0   IAB  0   Ectopic  0   Multiple  0   Live Births  5            Home Medications    Prior to Admission medications   Medication Sig Start Date End Date Taking? Authorizing Provider  Cetirizine HCl 10 MG CAPS Take 1 capsule (10 mg total) by mouth daily. 12/08/20  Yes Lakiah Dhingra C, PA-C  fluticasone (FLONASE) 50 MCG/ACT nasal spray Place 1-2 sprays into both nostrils daily. 12/08/20  Yes Jamari Diana C, PA-C  ibuprofen (ADVIL) 800 MG tablet Take 1 tablet (800 mg total) by mouth 3 (three)  times daily. 12/08/20  Yes Lavonna Lampron C, PA-C  tiZANidine (ZANAFLEX) 2 MG tablet Take 1-2 tablets (2-4 mg total) by mouth every 6 (six) hours as needed for muscle spasms. 12/08/20  Yes Rochell Mabie C, PA-C  buPROPion (WELLBUTRIN XL) 150 MG 24 hr tablet 1 tablet in the morning 11/14/20   [provider]  ergocalciferol (VITAMIN D2) 1.25 MG (50000 UT) capsule 1 capsule 11/15/20   [provider]  lidocaine (XYLOCAINE) 2 % solution Use as directed 15 mLs in the mouth or throat as needed for mouth pain. 08/20/20   Rhys Martini, PA-C  Prenatal Vit-Fe Fumarate-FA (PRENATAL MULTIVITAMIN) TABS tablet Take 1 tablet by mouth daily at 12 noon.    [provider]    Family History Family History  Problem Relation Age of Onset  . Hypertension Father   . Diabetes Paternal Grandmother   . Hypertension Paternal Grandmother   . Hypertension Maternal Grandmother   . Breast cancer Maternal Grandmother   . Hypertension Paternal Grandfather   . Diabetes Paternal Grandfather   . Hypothyroidism Maternal Aunt     Social History Social History   Tobacco Use  . Smoking status: Never  Smoker  . Smokeless tobacco: Never Used  Substance Use Topics  . Alcohol use: No  . Drug use: No     Allergies   Patient has no known allergies.   Review of Systems Review of Systems  Constitutional: Negative for activity change, appetite change, chills, fatigue and fever.  HENT: Positive for ear pain. Negative for congestion, rhinorrhea, sinus pressure, sore throat and trouble swallowing.   Eyes: Negative for discharge and redness.  Respiratory: Negative for cough, chest tightness and shortness of breath.   Cardiovascular: Negative for chest pain.  Gastrointestinal: Negative for abdominal pain, diarrhea, nausea and vomiting.  Musculoskeletal: Positive for myalgias and neck pain.  Skin: Negative for rash.  Neurological: Positive for headaches. Negative for dizziness and  light-headedness.     Physical Exam Triage Vital Signs ED Triage Vitals  Enc Vitals Group     BP      Pulse      Resp      Temp      Temp src      SpO2      Weight      Height      Head Circumference      Peak Flow      Pain Score      Pain Loc      Pain Edu?      Excl. in GC?    No data found.  Updated Vital Signs BP (!) 144/103   Pulse 90   Temp 97.9 F (36.6 C)   Resp 20   LMP 11/21/2020 (Approximate)   SpO2 97%   Visual Acuity Right Eye Distance:   Left Eye Distance:   Bilateral Distance:    Right Eye Near:   Left Eye Near:    Bilateral Near:     Physical Exam Vitals and nursing note reviewed.  Constitutional:      Appearance: She is well-developed.     Comments: No acute distress  HENT:     Head: Normocephalic and atraumatic.     Ears:     Comments: Bilateral ears without tenderness to palpation of external auricle, tragus and mastoid, EAC's without erythema or swelling,   Right TM with small effusion present     Nose: Nose normal.     Mouth/Throat:     Comments: Oral mucosa pink and moist, no tonsillar enlargement or exudate. Posterior pharynx patent and nonerythematous, no uvula deviation or swelling. Normal phonation. Eyes:     Extraocular Movements: Extraocular movements intact.     Conjunctiva/sclera: Conjunctivae normal.     Pupils: Pupils are equal, round, and reactive to light.  Neck:     Comments: Tenderness to palpation along right neck musculature, full active range of motion, no nuchal rigidity Cardiovascular:     Rate and Rhythm: Normal rate and regular rhythm.  Pulmonary:     Effort: Pulmonary effort is normal. No respiratory distress.     Comments: Breathing comfortably at rest, CTABL, no wheezing, rales or other adventitious sounds auscultated Abdominal:     General: There is no distension.  Musculoskeletal:        General: Normal range of motion.     Cervical back: Neck supple.  Skin:    General: Skin is warm and dry.   Neurological:     Mental Status: She is alert and oriented to person, place, and time.      UC Treatments / Results  Labs (all labs ordered are listed, but only abnormal results are  displayed) Labs Reviewed - No data to display  EKG   Radiology No results found.  Procedures Procedures (including critical care time)  Medications Ordered in UC Medications - No data to display  Initial Impression / Assessment and Plan / UC Course  I have reviewed the triage vital signs and the nursing notes.  Pertinent labs & imaging results that were available during my care of the patient were reviewed by me and considered in my medical decision making (see chart for details).     Eustachian tube dysfunction of right with likely associated sinus headache.  Recommending starting daily Zyrtec/Flonase, continue anti-inflammatories, providing tizanidine for neck discomfort which may be contributing to headache.  No signs of infection at this time, deferring any antibiotics.  Continue to monitor,Discussed strict return precautions. Patient verbalized understanding and is agreeable with plan.  Final Clinical Impressions(s) / UC Diagnoses   Final diagnoses:  Dysfunction of right eustachian tube  Strain of neck muscle, initial encounter  Acute nonintractable headache, unspecified headache type     Discharge Instructions     Begin daily cetirizine/Zyrtec and Flonase nasal spray 1 to 2 spray in each nostril daily to help with fluid on ears Drink plenty of fluids Tylenol and ibuprofen for pain Supplement tizanidine which is a muscle relaxer for neck strain-may cause drowsiness, limit use at home at bedtime  Follow-up if not improving or worsening    ED Prescriptions    Medication Sig Dispense Auth. Provider   fluticasone (FLONASE) 50 MCG/ACT nasal spray Place 1-2 sprays into both nostrils daily. 16 g Kaydra Borgen C, PA-C   Cetirizine HCl 10 MG CAPS Take 1 capsule (10 mg total) by mouth  daily. 15 capsule Alejandria Wessells C, PA-C   ibuprofen (ADVIL) 800 MG tablet Take 1 tablet (800 mg total) by mouth 3 (three) times daily. 21 tablet Denzell Colasanti C, PA-C   tiZANidine (ZANAFLEX) 2 MG tablet Take 1-2 tablets (2-4 mg total) by mouth every 6 (six) hours as needed for muscle spasms. 24 tablet Lateesha Bezold, Delano C, PA-C     PDMP not reviewed this encounter.   Renika Shiflet, Sylva C, PA-C 12/08/20 1031

## 2020-12-08 NOTE — Discharge Instructions (Signed)
Begin daily cetirizine/Zyrtec and Flonase nasal spray 1 to 2 spray in each nostril daily to help with fluid on ears Drink plenty of fluids Tylenol and ibuprofen for pain Supplement tizanidine which is a muscle relaxer for neck strain-may cause drowsiness, limit use at home at bedtime  Follow-up if not improving or worsening

## 2020-12-16 ENCOUNTER — Other Ambulatory Visit: Payer: Self-pay

## 2020-12-16 ENCOUNTER — Encounter (HOSPITAL_COMMUNITY): Payer: Self-pay | Admitting: Emergency Medicine

## 2020-12-16 ENCOUNTER — Emergency Department (HOSPITAL_COMMUNITY)
Admission: EM | Admit: 2020-12-16 | Discharge: 2020-12-16 | Disposition: A | Discharge status: Home / Self Care | Payer: Medicaid Other | Attending: Emergency Medicine | Admitting: Emergency Medicine

## 2020-12-16 ENCOUNTER — Emergency Department (HOSPITAL_COMMUNITY): Payer: Medicaid Other

## 2020-12-16 DIAGNOSIS — H9201 Otalgia, right ear: Secondary | ICD-10-CM | POA: Diagnosis not present

## 2020-12-16 DIAGNOSIS — W010XXA Fall on same level from slipping, tripping and stumbling without subsequent striking against object, initial encounter: Secondary | ICD-10-CM | POA: Insufficient documentation

## 2020-12-16 DIAGNOSIS — W19XXXA Unspecified fall, initial encounter: Secondary | ICD-10-CM

## 2020-12-16 DIAGNOSIS — M546 Pain in thoracic spine: Secondary | ICD-10-CM | POA: Diagnosis present

## 2020-12-16 DIAGNOSIS — R519 Headache, unspecified: Secondary | ICD-10-CM | POA: Insufficient documentation

## 2020-12-16 DIAGNOSIS — M542 Cervicalgia: Secondary | ICD-10-CM | POA: Diagnosis not present

## 2020-12-16 DIAGNOSIS — M545 Low back pain, unspecified: Secondary | ICD-10-CM | POA: Insufficient documentation

## 2020-12-16 LAB — POC URINE PREG, ED: Preg Test, Ur: NEGATIVE

## 2020-12-16 MED ORDER — METHOCARBAMOL 1000 MG/10ML IJ SOLN: 1000.0000 mg | Freq: Once | INTRAMUSCULAR | Status: DC

## 2020-12-16 MED ORDER — KETOROLAC TROMETHAMINE 30 MG/ML IJ SOLN
30.0000 mg | Freq: Once | INTRAMUSCULAR | Status: AC
  Administered 2020-12-16: 30 mg via INTRAVENOUS
  Filled 2020-12-16: qty 1

## 2020-12-16 MED ORDER — METHOCARBAMOL 1000 MG/10ML IJ SOLN
1000.0000 mg | Freq: Once | INTRAVENOUS | Status: AC
  Administered 2020-12-16: 1000 mg via INTRAVENOUS
  Filled 2020-12-16 (×2): qty 10

## 2020-12-16 MED ORDER — OXYCODONE HCL 5 MG PO TABS
5.0000 mg | ORAL_TABLET | Freq: Once | ORAL | Status: AC
  Administered 2020-12-16: 5 mg via ORAL
  Filled 2020-12-16: qty 1

## 2020-12-16 MED ORDER — MELOXICAM 15 MG PO TABS: 15.0000 mg | ORAL_TABLET | Freq: Every day | ORAL | 0 refills | Status: DC

## 2020-12-16 MED ORDER — METHOCARBAMOL 500 MG PO TABS: 500.0000 mg | ORAL_TABLET | Freq: Two times a day (BID) | ORAL | 0 refills | Status: DC

## 2020-12-16 NOTE — ED Provider Notes (Signed)
[facility] EMERGENCY DEPARTMENT Provider Note   CSN: [id number] Arrival date & time: 12/16/20  0216     History Chief Complaint  Patient presents with  . Fall    Sheryl Monroe is a 25 y.o. female presents to the Emergency Department complaining of gradual, persistent, progressively worsening neck and back pain onset 6 days ago.  Pt reports she fell 2 weeks ago due to a slip and landed on her back.  Pt reports she did not hit her head or have an LOC.  Pt reports initially she did not have significnat pain, but days later began to develop soreness and worsening feeling of tightness in her back.  Pt was evaluated at urgent care on 4/10 for same and given ibuprofen and Zanaflex.  She reports these medications do not help.  She reports associated generalized headache without vision changes, nausea, vomiting, or neck stiffness.  She is not taking an anticoagulant. Pt reports some associated R ear pain for which she has used Flonase without relief.   Denies gait instability, numbness, tingling, weakness, loss of bowel or bladder control.     The history is provided by the patient and medical records. No language interpreter was used.       Past Medical History:  Diagnosis Date  . Bacterial vaginosis   . Chlamydia   . Gestational diabetes    with first pregnancy  . Morbid obesity with BMI of 50.0-59.9, adult [facility])     Patient Active Problem List   Diagnosis Date Noted  . Postpartum care following cesarean delivery 09/20/2018  . Encounter for elective induction of labor 09/17/2018  . Social isolation 09/01/2013    Past Surgical History:  Procedure Laterality Date  . CESAREAN SECTION WITH BILATERAL TUBAL LIGATION N/A 09/18/2018   Procedure: CESAREAN SECTION WITH BILATERAL TUBAL LIGATION;  Surgeon: Vena Austria, MD;  Location: [facility] ORS;  Service: Obstetrics;  Laterality: N/A;  . NO PAST SURGERIES       OB History    Gravida  5   Para  5   Term  5    Preterm  0   AB  0   Living  5     SAB  0   IAB  0   Ectopic  0   Multiple  0   Live Births  5           Family History  Problem Relation Age of Onset  . Hypertension Father   . Diabetes Paternal Grandmother   . Hypertension Paternal Grandmother   . Hypertension Maternal Grandmother   . Breast cancer Maternal Grandmother   . Hypertension Paternal Grandfather   . Diabetes Paternal Grandfather   . Hypothyroidism Maternal Aunt     Social History   Tobacco Use  . Smoking status: Never Smoker  . Smokeless tobacco: Never Used  Substance Use Topics  . Alcohol use: No  . Drug use: No    Home Medications Prior to Admission medications   Medication Sig Start Date End Date Taking? Authorizing Provider  fluticasone (FLONASE) 50 MCG/ACT nasal spray Place 1-2 sprays into both nostrils daily. Patient taking differently: Place 1-2 sprays into both nostrils daily as needed for allergies. 12/08/20  Yes Wieters, Hallie C, PA-C  meloxicam (MOBIC) 15 MG tablet Take 1 tablet (15 mg total) by mouth daily. 12/16/20  Yes Vincen Bejar, Dahlia Client, PA-C  methocarbamol (ROBAXIN) 500 MG tablet Take 1-2 tablets (500-1,000 mg total) by mouth 2 (two) times daily. 12/16/20  Yes Hutson Luft, Dahlia Client, PA-C  Cetirizine HCl 10 MG CAPS Take 1 capsule (10 mg total) by mouth daily. Patient not taking: No sig reported 12/08/20 12/16/20  Wieters, Fran Lowes C, PA-C    Allergies    Patient has no known allergies.  Review of Systems   Review of Systems  Constitutional: Negative for appetite change, diaphoresis, fatigue, fever and unexpected weight change.  HENT: Positive for ear pain. Negative for mouth sores.   Eyes: Negative for visual disturbance.  Respiratory: Negative for cough, chest tightness, shortness of breath and wheezing.   Cardiovascular: Negative for chest pain.  Gastrointestinal: Negative for abdominal pain, constipation, diarrhea, nausea and vomiting.  Endocrine: Negative for polydipsia,  polyphagia and polyuria.  Genitourinary: Negative for dysuria, frequency, hematuria and urgency.  Musculoskeletal: Positive for back pain and neck pain. Negative for neck stiffness.  Skin: Negative for rash.  Allergic/Immunologic: Negative for immunocompromised state.  Neurological: Positive for headaches. Negative for syncope and light-headedness.  Hematological: Does not bruise/bleed easily.  Psychiatric/Behavioral: Negative for sleep disturbance. The patient is not nervous/anxious.     Physical Exam Updated Vital Signs BP (!) 154/120 (BP Location: Right Arm)   Pulse 92   Temp 98.4 F (36.9 C) (Oral)   Resp 20   Ht 5\' 5"  (1.651 m)   Wt (!) 140 kg   LMP 11/21/2020 (Approximate)   SpO2 99%   BMI 51.36 kg/m   Physical Exam Vitals and nursing note reviewed.  Constitutional:      General: She is not in acute distress.    Appearance: She is well-developed. She is not diaphoretic.  HENT:     Head: Normocephalic and atraumatic.     Mouth/Throat:     Pharynx: No oropharyngeal exudate.  Eyes:     Conjunctiva/sclera: Conjunctivae normal.  Neck:     Comments: Full ROM with difficulty TTP along the paraspinal muscles No step off or deformity Cardiovascular:     Rate and Rhythm: Normal rate and regular rhythm.  Pulmonary:     Effort: Pulmonary effort is normal. No respiratory distress.     Breath sounds: Normal breath sounds. No wheezing.  Abdominal:     General: There is no distension.     Palpations: Abdomen is soft.     Tenderness: There is no abdominal tenderness.  Musculoskeletal:     Cervical back: Normal range of motion and neck supple. Muscular tenderness present. No spinous process tenderness.     Comments: Full range of motion of the T-spine and L-spine No midline tenderness to the  T-spine or L-spine Mild Tenderness to palpation of the paraspinous muscles of the T spine and L-spine  Lymphadenopathy:     Cervical: No cervical adenopathy.  Skin:    General: Skin  is warm and dry.     Findings: No erythema or rash.  Neurological:     Mental Status: She is alert.     Comments: Speech is clear and goal oriented, follows commands Normal 5/5 strength in upper and lower extremities bilaterally including dorsiflexion and plantar flexion, strong and equal grip strength Sensation normal to light and sharp touch Moves extremities without ataxia, coordination intact Normal gait Normal balance No Clonus   Psychiatric:        Behavior: Behavior normal.     ED Results / Procedures / Treatments   Labs (all labs ordered are listed, but only abnormal results are displayed) Labs Reviewed  POC URINE PREG, ED    Radiology No results found.  Procedures  Procedures   Medications Ordered in ED Medications  ketorolac (TORADOL) 30 MG/ML injection 30 mg (30 mg Intravenous Given 12/16/20 0335)  methocarbamol (ROBAXIN) 1,000 mg in dextrose 5 % 100 mL IVPB (0 mg Intravenous Stopped 12/16/20 0412)  oxyCODONE (Oxy IR/ROXICODONE) immediate release tablet 5 mg (5 mg Oral Given 12/16/20 6283)    ED Course  I have reviewed the triage vital signs and the nursing notes.  Pertinent labs & imaging results that were available during my care of the patient were reviewed by me and considered in my medical decision making (see chart for details).    MDM Rules/Calculators/A&P                           Patient presents with back pain for the last 2 weeks after fall.  Reports no improvement with Zanaflex and ibuprofen.  Exam reassuring without step-off, deformity or midline tenderness.  She does have palpable muscle spasm of the trapezius and paraspinal muscles.  Will give Robaxin, Toradol and reassess.  5:13 AM Patient reports no improvement with IV muscle relaxer and Toradol.  Patient has not had any imaging since follow-up.  No midline tenderness but palpable muscle spasm and paraspinal tenderness.  Will obtain plain films to ensure no compression fracture.  6:40 AM At  shift change care was transferred to [facility] who will follow pending studies, re-evaulate and determine disposition. Plan for d/c home if x-rays are without emergent condition.    Final Clinical Impression(s) / ED Diagnoses Final diagnoses:  Fall, initial encounter  Acute bilateral thoracic back pain  Acute bilateral low back pain without sciatica    Rx / DC Orders ED Discharge Orders         Ordered    methocarbamol (ROBAXIN) 500 MG tablet  2 times daily        12/16/20 0612    meloxicam (MOBIC) 15 MG tablet  Daily        12/16/20 0612           Augusto Deckman, Dahlia Client, PA-C 12/16/20 0640    Pollyann Savoy, MD 12/16/20 1312

## 2020-12-16 NOTE — Discharge Instructions (Signed)
1. Medications: robaxin, Mobic, usual home medications 2. Treatment: rest, drink plenty of fluids, gentle stretching as discussed, alternate ice and heat 3. Follow Up: Please followup with your primary doctor in 3 days for discussion of your diagnoses and further evaluation after today's visit; if you do not have a primary care doctor use the resource guide provided to find one;  Return to the ER for worsening back pain, difficulty walking, loss of bowel or bladder control or other concerning symptoms

## 2020-12-16 NOTE — ED Triage Notes (Signed)
Patient slipped and fell at [employer] 2 weeks ago reports pain at entire back last week with mild headache and posterior neck pain . No LOC/ambulatory.

## 2020-12-26 ENCOUNTER — Other Ambulatory Visit: Payer: Self-pay

## 2020-12-26 ENCOUNTER — Emergency Department (HOSPITAL_COMMUNITY): Payer: Medicaid Other

## 2020-12-26 ENCOUNTER — Encounter (HOSPITAL_COMMUNITY): Payer: Self-pay | Admitting: Emergency Medicine

## 2020-12-26 ENCOUNTER — Emergency Department (HOSPITAL_COMMUNITY)
Admission: EM | Admit: 2020-12-26 | Discharge: 2020-12-26 | Disposition: A | Discharge status: Home / Self Care | Payer: Medicaid Other | Attending: Emergency Medicine | Admitting: Emergency Medicine

## 2020-12-26 DIAGNOSIS — M542 Cervicalgia: Secondary | ICD-10-CM | POA: Diagnosis not present

## 2020-12-26 DIAGNOSIS — R519 Headache, unspecified: Secondary | ICD-10-CM | POA: Insufficient documentation

## 2020-12-26 DIAGNOSIS — R112 Nausea with vomiting, unspecified: Secondary | ICD-10-CM | POA: Diagnosis not present

## 2020-12-26 DIAGNOSIS — R630 Anorexia: Secondary | ICD-10-CM | POA: Insufficient documentation

## 2020-12-26 DIAGNOSIS — R1011 Right upper quadrant pain: Secondary | ICD-10-CM | POA: Diagnosis not present

## 2020-12-26 DIAGNOSIS — M549 Dorsalgia, unspecified: Secondary | ICD-10-CM | POA: Diagnosis not present

## 2020-12-26 DIAGNOSIS — R42 Dizziness and giddiness: Secondary | ICD-10-CM | POA: Diagnosis not present

## 2020-12-26 DIAGNOSIS — H538 Other visual disturbances: Secondary | ICD-10-CM | POA: Diagnosis not present

## 2020-12-26 DIAGNOSIS — W19XXXA Unspecified fall, initial encounter: Secondary | ICD-10-CM | POA: Diagnosis not present

## 2020-12-26 LAB — CBC
HCT: 37.9 % (ref 36.0–46.0)
Hemoglobin: 11.5 g/dL — ABNORMAL LOW (ref 12.0–15.0)
MCH: 25.3 pg — ABNORMAL LOW (ref 26.0–34.0)
MCHC: 30.3 g/dL (ref 30.0–36.0)
MCV: 83.3 fL (ref 80.0–100.0)
Platelets: 445 10*3/uL — ABNORMAL HIGH (ref 150–400)
RBC: 4.55 MIL/uL (ref 3.87–5.11)
RDW: 15.5 % (ref 11.5–15.5)
WBC: 6.3 10*3/uL (ref 4.0–10.5)
nRBC: 0 % (ref 0.0–0.2)

## 2020-12-26 LAB — HEPATIC FUNCTION PANEL
ALT: 10 U/L (ref 0–44)
AST: 13 U/L — ABNORMAL LOW (ref 15–41)
Albumin: 3.3 g/dL — ABNORMAL LOW (ref 3.5–5.0)
Alkaline Phosphatase: 77 U/L (ref 38–126)
Bilirubin, Direct: <0.1 mg/dL (ref 0.0–0.2)
Total Bilirubin: 0.5 mg/dL (ref 0.3–1.2)
Total Protein: 7.9 g/dL (ref 6.5–8.1)

## 2020-12-26 LAB — PROTIME-INR
INR: 1 (ref 0.8–1.2)
Prothrombin Time: 13.6 seconds (ref 11.4–15.2)

## 2020-12-26 LAB — BASIC METABOLIC PANEL
Anion gap: 8 (ref 5–15)
BUN: 8 mg/dL (ref 6–20)
CO2: 25 mmol/L (ref 22–32)
Calcium: 9.1 mg/dL (ref 8.9–10.3)
Chloride: 102 mmol/L (ref 98–111)
Creatinine, Ser: 0.65 mg/dL (ref 0.44–1.00)
GFR, Estimated: >60 mL/min (ref >60)
Glucose, Bld: 97 mg/dL (ref 70–99)
Potassium: 4 mmol/L (ref 3.5–5.1)
Sodium: 135 mmol/L (ref 135–145)

## 2020-12-26 LAB — DIFFERENTIAL
Abs Immature Granulocytes: 0.02 10*3/uL (ref 0.00–0.07)
Basophils Absolute: 0.1 10*3/uL (ref 0.0–0.1)
Basophils Relative: 1 %
Eosinophils Absolute: 0.1 10*3/uL (ref 0.0–0.5)
Eosinophils Relative: 2 %
Immature Granulocytes: 0 %
Lymphocytes Relative: 35 %
Lymphs Abs: 2.2 10*3/uL (ref 0.7–4.0)
Monocytes Absolute: 0.4 10*3/uL (ref 0.1–1.0)
Monocytes Relative: 7 %
Neutro Abs: 3.5 10*3/uL (ref 1.7–7.7)
Neutrophils Relative %: 55 %

## 2020-12-26 LAB — TROPONIN I (HIGH SENSITIVITY): Troponin I (High Sensitivity): <2 ng/L (ref <18)

## 2020-12-26 LAB — I-STAT CHEM 8, ED
BUN: 7 mg/dL (ref 6–20)
Calcium, Ion: 1.21 mmol/L (ref 1.15–1.40)
Chloride: 104 mmol/L (ref 98–111)
Creatinine, Ser: 0.6 mg/dL (ref 0.44–1.00)
Glucose, Bld: 96 mg/dL (ref 70–99)
HCT: 37 % (ref 36.0–46.0)
Hemoglobin: 12.6 g/dL (ref 12.0–15.0)
Potassium: 4.1 mmol/L (ref 3.5–5.1)
Sodium: 139 mmol/L (ref 135–145)
TCO2: 29 mmol/L (ref 22–32)

## 2020-12-26 LAB — LIPASE, BLOOD: Lipase: 27 U/L (ref 11–51)

## 2020-12-26 LAB — I-STAT BETA HCG BLOOD, ED (MC, WL, AP ONLY): I-stat hCG, quantitative: <5 m[IU]/mL (ref <5)

## 2020-12-26 LAB — APTT: aPTT: 28 seconds (ref 24–36)

## 2020-12-26 LAB — CBG MONITORING, ED: Glucose-Capillary: 101 mg/dL — ABNORMAL HIGH (ref 70–99)

## 2020-12-26 MED ORDER — GADOBUTROL 1 MMOL/ML IV SOLN
127.0000 mL | Freq: Once | INTRAVENOUS | Status: AC | PRN
  Administered 2020-12-26: 10 mL via INTRAVENOUS

## 2020-12-26 MED ORDER — SODIUM CHLORIDE 0.9% FLUSH
3.0000 mL | Freq: Once | INTRAVENOUS | Status: AC
  Administered 2020-12-26: 3 mL via INTRAVENOUS

## 2020-12-26 MED ORDER — METOCLOPRAMIDE HCL 5 MG/ML IJ SOLN
10.0000 mg | Freq: Once | INTRAMUSCULAR | Status: AC
  Administered 2020-12-26: 10 mg via INTRAVENOUS
  Filled 2020-12-26: qty 2

## 2020-12-26 NOTE — Discharge Instructions (Addendum)
Ms. Detzel, It was a pleasure meeting you. During your time here, we completed a CT head which was normal. We also completed MRI of your brain, spine. There were small spots, which could represent multiple sclerosis, or could be benign. I would suggest calling the neurologist to follow-up within the next few weeks. I have sent something in for your nausea as well. Please return to the ED or your primary care if symptoms worsen.  Thank you, Evlyn Kanner, MD

## 2020-12-26 NOTE — ED Notes (Signed)
Pt also states she keeps feeling like she is going to pass out

## 2020-12-26 NOTE — ED Provider Notes (Signed)
[facility] EMERGENCY DEPARTMENT Provider Note   CSN: [id number] Arrival date & time: 12/26/20  8657     History Chief Complaint  Patient presents with  . Headache  . Blurred Vision    Sheryl Monroe is a 25 y.o. female presenting to emergency department with headache, nausea, and decreased vision from R eye. Patient reports she fell in [employer] earlier this month after slipping on the floor. Denies dizziness at that time, does not remember if she hit her head. Since then she has had back and neck pain with generalized headache. States relief only comes from laying flat on bed. She has been seen multiple times by urgent care but symptoms have persisted. Denies numbness, tingling, bowel/urinary bowel incontinence. She reports trying meloxicam, robaxin, and ibuprofen all without relief. Three days ago reports onset of worsening headache and decreased vision out of R eye. This has persisted through this morning. These symptoms are associated with nausea, vomiting, and decreased appetite. She also reports feeling dizzy upon standing. She denies history of smoking, alcohol, or other drug use. She did not take any medications this morning.      Past Medical History:  Diagnosis Date  . Bacterial vaginosis   . Chlamydia   . Gestational diabetes    with first pregnancy  . Morbid obesity with BMI of 50.0-59.9, adult [facility])     Patient Active Problem List   Diagnosis Date Noted  . Postpartum care following cesarean delivery 09/20/2018  . Encounter for elective induction of labor 09/17/2018  . Social isolation 09/01/2013    Past Surgical History:  Procedure Laterality Date  . CESAREAN SECTION WITH BILATERAL TUBAL LIGATION N/A 09/18/2018   Procedure: CESAREAN SECTION WITH BILATERAL TUBAL LIGATION;  Surgeon: Vena Austria, MD;  Location: [facility] ORS;  Service: Obstetrics;  Laterality: N/A;  . NO PAST SURGERIES       OB History    Gravida  5   Para  5   Term  5    Preterm  0   AB  0   Living  5     SAB  0   IAB  0   Ectopic  0   Multiple  0   Live Births  5           Family History  Problem Relation Age of Onset  . Hypertension Father   . Diabetes Paternal Grandmother   . Hypertension Paternal Grandmother   . Hypertension Maternal Grandmother   . Breast cancer Maternal Grandmother   . Hypertension Paternal Grandfather   . Diabetes Paternal Grandfather   . Hypothyroidism Maternal Aunt     Social History   Tobacco Use  . Smoking status: Never Smoker  . Smokeless tobacco: Never Used  Substance Use Topics  . Alcohol use: No  . Drug use: No    Home Medications Prior to Admission medications   Medication Sig Start Date End Date Taking? Authorizing Provider  fluticasone (FLONASE) 50 MCG/ACT nasal spray Place 1-2 sprays into both nostrils daily. Patient taking differently: Place 1-2 sprays into both nostrils daily as needed for allergies. 12/08/20   Wieters, Hallie C, PA-C  meloxicam (MOBIC) 15 MG tablet Take 1 tablet (15 mg total) by mouth daily. 12/16/20   Muthersbaugh, Dahlia Client, PA-C  methocarbamol (ROBAXIN) 500 MG tablet Take 1-2 tablets (500-1,000 mg total) by mouth 2 (two) times daily. 12/16/20   Muthersbaugh, Dahlia Client, PA-C  Cetirizine HCl 10 MG CAPS Take 1 capsule (10 mg total) by  mouth daily. Patient not taking: No sig reported 12/08/20 12/16/20  Wieters, Fran Lowes C, PA-C    Allergies    Patient has no known allergies.  Review of Systems   Review of Systems  Constitutional: Positive for appetite change. Negative for chills and fever.  Eyes: Positive for photophobia and visual disturbance.  Respiratory: Negative for cough and shortness of breath.   Cardiovascular: Negative for chest pain and leg swelling.  Gastrointestinal: Positive for nausea and vomiting. Negative for constipation and diarrhea.  Endocrine: Negative for polyuria.  Genitourinary: Negative for dysuria and urgency.    Physical Exam Updated Vital  Signs BP (!) 152/116   Pulse 86   Temp 98.9 F (37.2 C) (Oral)   Resp 18   Ht 5\' 5"  (1.651 m)   Wt 127 kg   SpO2 100%   BMI 46.59 kg/m   Physical Exam Constitutional:      General: She is awake. She is not in acute distress.    Appearance: She is overweight. She is not ill-appearing, toxic-appearing or diaphoretic.  HENT:     Head: Normocephalic and atraumatic.  Eyes:     General: Lids are normal.     Comments: Difficult to assess EOM due to pain. EOM most limited with R eye abduction.  Cardiovascular:     Rate and Rhythm: Normal rate and regular rhythm.     Pulses: Normal pulses.     Heart sounds: Normal heart sounds.  Pulmonary:     Effort: Pulmonary effort is normal.     Breath sounds: Normal breath sounds.  Abdominal:     General: Bowel sounds are normal. There is no distension.     Palpations: Abdomen is soft.     Tenderness: There is abdominal tenderness in the right upper quadrant and epigastric area.  Musculoskeletal:     Right lower leg: No edema.     Left lower leg: No edema.  Skin:    General: Skin is warm and dry.  Neurological:     Mental Status: She is alert and oriented to person, place, and time.     Cranial Nerves: Cranial nerves are intact.     Comments: 4/5 strength on RUE, 5/5 elsewhere. Decreased sensation on RUE as well.   Psychiatric:        Mood and Affect: Mood normal.        Behavior: Behavior is cooperative.    ED Results / Procedures / Treatments   Labs (all labs ordered are listed, but only abnormal results are displayed) Labs Reviewed  CBC - Abnormal; Notable for the following components:      Result Value   Hemoglobin 11.5 (*)    MCH 25.3 (*)    Platelets 445 (*)    All other components within normal limits  HEPATIC FUNCTION PANEL - Abnormal; Notable for the following components:   Albumin 3.3 (*)    AST 13 (*)    All other components within normal limits  CBG MONITORING, ED - Abnormal; Notable for the following components:    Glucose-Capillary 101 (*)    All other components within normal limits  BASIC METABOLIC PANEL  PROTIME-INR  APTT  DIFFERENTIAL  LIPASE, BLOOD  I-STAT BETA HCG BLOOD, ED ([facility], [facility], [facility] ONLY)  I-STAT CHEM 8, ED  TROPONIN I (HIGH SENSITIVITY)  TROPONIN I (HIGH SENSITIVITY)   EKG EKG Interpretation  Date/Time:  Thursday December 26 2020 08:53:55 EDT Ventricular Rate:  84 PR Interval:  197 QRS Duration: 104 QT  Interval:  375 QTC Calculation: 444 R Axis:   7 Text Interpretation: Sinus rhythm unremarkable ecg Confirmed by Gerhard Munch [phone]) on 12/26/2020 9:55:06 AM   Radiology DG Chest 2 View  Result Date: 12/26/2020 CLINICAL DATA:  Chest pain.  Hypertension EXAM: CHEST - 2 VIEW COMPARISON:  October 20, 2019 FINDINGS: There is mild elevation of the right hemidiaphragm. Lungs are clear. Heart is borderline prominent with pulmonary vascularity normal. No adenopathy. No pneumothorax. No bone lesions. IMPRESSION: Bilateral a shin of right hemidiaphragm. Lungs clear. Heart borderline prominent. No adenopathy. Electronically Signed   By: Bretta Bang III M.D.   On: 12/26/2020 09:35   CT HEAD WO CONTRAST  Result Date: 12/26/2020 CLINICAL DATA:  Diplopia EXAM: CT HEAD WITHOUT CONTRAST TECHNIQUE: Contiguous axial images were obtained from the base of the skull through the vertex without intravenous contrast. COMPARISON:  October 20, 2019 FINDINGS: Brain: Ventricles and sulci are normal in size and configuration. There is no intracranial mass, hemorrhage, extra-axial fluid collection, or midline shift. The brain parenchyma appears unremarkable. No evident acute infarct. Vascular: No hyperdense vessel.  No evident vascular calcification. Skull: The bony calvarium appears intact. Sinuses/Orbits: Visualized paranasal sinuses are clear. Orbits appear symmetric bilaterally. Other: Visualized mastoid air cells are clear. IMPRESSION: Study within normal limits. Electronically Signed   By: Bretta Bang III M.D.   On: 12/26/2020 09:39   MR Brain W and Wo Contrast  Result Date: 12/26/2020 CLINICAL DATA:  Headache, new or worsening. Concern for multiple sclerosis. EXAM: MRI HEAD WITHOUT AND WITH CONTRAST MRI CERVICAL SPINE WITHOUT AND WITH CONTRAST TECHNIQUE: Multiplanar, multiecho pulse sequences of the brain and surrounding structures were obtained without and with intravenous contrast. Multiplanar and multiecho pulse sequences of the cervical spine, to include the craniocervical junction and cervicothoracic junction, were obtained without and with intravenous contrast. CONTRAST:  41mL GADAVIST GADOBUTROL 1 MMOL/ML IV SOLN COMPARISON:  CT head October 20, 2019. FINDINGS: HEAD: Brain: No acute infarction, hemorrhage, hydrocephalus, extra-axial collection or mass lesion. Scattered small T2/FLAIR hyperintensities predominantly in the juxtacortical white matter, which are mildly advanced in number for age. No abnormal enhancement. Vascular: Major arterial flow voids are maintained skull base. Skull and upper cervical spine: Diffuse T1 hypointensity of the marrow. No focal marrow replacing lesion. Sinuses/Orbits: Largely clear sinuses.  Unremarkable orbits. Other: No mastoid effusions.  Prominent tonsils and adenoids. CERVICAL SPINE: Alignment: Straightening of the normal cervical lordosis. No substantial sagittal subluxation. Vertebrae: Diffuse T1 hypointensity of the marrow. Cord: No convincing abnormal cord signal. No normal cord enhancement. Posterior Fossa, vertebral arteries, paraspinal tissues: Visualized vertebral artery flow voids are maintained. Posterior fossa is better characterized on same day MRI head Disc levels: No significant disc protrusion, foraminal stenosis, or canal stenosis. Small posterior disc osteophyte complex at C5-C6. IMPRESSION: MRI head: 1. No acute intracranial abnormality. 2. Mild scattered small T2/FLAIR hyperintensities predominantly in the juxtacortical white matter,  which are mildly advanced in number for age. This finding is nonspecific but can be seen in the setting of chronic microvascular ischemia, a demyelinating process such as multiple sclerosis, chronic migraines, or as the sequelae of prior inflammation. No abnormal enhancement. 3. Prominent tonsils and adenoids, most likely related to hypertrophy. Recommend correlation with direct inspection. MRI cervical spine: 1. No convincing cord signal abnormality or enhancement. 2. Diffuse T1 hypointensity of the marrow, which is nonspecific but can be seen with chronic anemia, chronic hypoxia (such as in smokers), or lymphoproliferative disorders. 3. No significant canal or foraminal stenosis.  Electronically Signed   By: Feliberto HartsFrederick S Alarie MD   On: 12/26/2020 15:03   MR Cervical Spine W or Wo Contrast  Result Date: 12/26/2020 CLINICAL DATA:  Headache, new or worsening. Concern for multiple sclerosis. EXAM: MRI HEAD WITHOUT AND WITH CONTRAST MRI CERVICAL SPINE WITHOUT AND WITH CONTRAST TECHNIQUE: Multiplanar, multiecho pulse sequences of the brain and surrounding structures were obtained without and with intravenous contrast. Multiplanar and multiecho pulse sequences of the cervical spine, to include the craniocervical junction and cervicothoracic junction, were obtained without and with intravenous contrast. CONTRAST:  10mL GADAVIST GADOBUTROL 1 MMOL/ML IV SOLN COMPARISON:  CT head October 20, 2019. FINDINGS: HEAD: Brain: No acute infarction, hemorrhage, hydrocephalus, extra-axial collection or mass lesion. Scattered small T2/FLAIR hyperintensities predominantly in the juxtacortical white matter, which are mildly advanced in number for age. No abnormal enhancement. Vascular: Major arterial flow voids are maintained skull base. Skull and upper cervical spine: Diffuse T1 hypointensity of the marrow. No focal marrow replacing lesion. Sinuses/Orbits: Largely clear sinuses.  Unremarkable orbits. Other: No mastoid effusions.   Prominent tonsils and adenoids. CERVICAL SPINE: Alignment: Straightening of the normal cervical lordosis. No substantial sagittal subluxation. Vertebrae: Diffuse T1 hypointensity of the marrow. Cord: No convincing abnormal cord signal. No normal cord enhancement. Posterior Fossa, vertebral arteries, paraspinal tissues: Visualized vertebral artery flow voids are maintained. Posterior fossa is better characterized on same day MRI head Disc levels: No significant disc protrusion, foraminal stenosis, or canal stenosis. Small posterior disc osteophyte complex at C5-C6. IMPRESSION: MRI head: 1. No acute intracranial abnormality. 2. Mild scattered small T2/FLAIR hyperintensities predominantly in the juxtacortical white matter, which are mildly advanced in number for age. This finding is nonspecific but can be seen in the setting of chronic microvascular ischemia, a demyelinating process such as multiple sclerosis, chronic migraines, or as the sequelae of prior inflammation. No abnormal enhancement. 3. Prominent tonsils and adenoids, most likely related to hypertrophy. Recommend correlation with direct inspection. MRI cervical spine: 1. No convincing cord signal abnormality or enhancement. 2. Diffuse T1 hypointensity of the marrow, which is nonspecific but can be seen with chronic anemia, chronic hypoxia (such as in smokers), or lymphoproliferative disorders. 3. No significant canal or foraminal stenosis. Electronically Signed   By: Feliberto HartsFrederick S Arceneaux MD   On: 12/26/2020 15:03    Procedures Procedures   Medications Ordered in ED Medications  metoCLOPramide (REGLAN) injection 10 mg (has no administration in time range)  sodium chloride flush (NS) 0.9 % injection 3 mL (3 mLs Intravenous Given 12/26/20 1036)  metoCLOPramide (REGLAN) injection 10 mg (10 mg Intravenous Given 12/26/20 1035)  gadobutrol (GADAVIST) 1 MMOL/ML injection 127 mL (10 mLs Intravenous Contrast Given 12/26/20 1435)   ED Course  I have reviewed  the triage vital signs and the nursing notes.  Pertinent labs & imaging results that were available during my care of the patient were reviewed by me and considered in my medical decision making (see chart for details).    MDM Rules/Calculators/A&P                         Patient afebrile, hemodynamically stable on room air. Presentation concerning with worsening headache, nausea, abdominal pain, and pain with eye movement. This is associated with dizziness, possibly from dehydration. CT head without acute abnormalities. Will obtain CMP, lipase, CBC, orthostatics. Concern for demyelinating disease with possible optic neuritis, RUE weakness/decreased sensation. Will obtain MRI for further evaluation.   Lab work overall unremarkable. MRI w/ juxtacortical  hyperintensities, nonspecific. Discussed with patient, who states she is feeling better but continues to have HA. Will give one more dose Reglan, fluid/po challenge. Expected discharge once tolerating po.   Final Clinical Impression(s) / ED Diagnoses Final diagnoses:  Blurred vision   Rx / DC Orders ED Discharge Orders    None       Evlyn Kanner, MD 12/26/20 9935    Gerhard Munch, MD 12/26/20 1630

## 2020-12-26 NOTE — ED Notes (Signed)
Pt taken to MRI prior to repeat vitals being collected

## 2020-12-26 NOTE — ED Notes (Signed)
Patient transported to X-ray 

## 2020-12-26 NOTE — ED Notes (Signed)
Taken to MRI by transporter.  °

## 2020-12-26 NOTE — ED Notes (Signed)
Sitting on stretcher, no distress, talking on her cell phone

## 2020-12-26 NOTE — ED Notes (Signed)
Spoke with [facility] staff, staff states she will request lab to send back the dark green top to [facility]

## 2020-12-26 NOTE — ED Triage Notes (Signed)
Pt states she fell at the beginning of April. Pt was seen and has been having neck/back pain. Pt has been feeling dizzy as well. Pt states nothing over the counter helps. Pt states on Tuesday she developed blurry vision in her right eye that has worsened and also started having chest pain under her right breast.

## 2020-12-26 NOTE — ED Notes (Signed)
CT transport here to pick up pt.  Pt is in xray, CT transport will pick up pt in Xray

## 2021-01-02 ENCOUNTER — Encounter (HOSPITAL_COMMUNITY): Payer: Self-pay

## 2021-01-02 ENCOUNTER — Emergency Department (HOSPITAL_COMMUNITY)
Admission: EM | Admit: 2021-01-02 | Discharge: 2021-01-02 | Disposition: A | Discharge status: Home / Self Care | Payer: Medicaid Other | Attending: Emergency Medicine | Admitting: Emergency Medicine

## 2021-01-02 ENCOUNTER — Other Ambulatory Visit: Payer: Self-pay

## 2021-01-02 ENCOUNTER — Emergency Department (HOSPITAL_COMMUNITY)
Admission: EM | Admit: 2021-01-02 | Discharge: 2021-01-03 | Disposition: A | Discharge status: Home / Self Care | Payer: Medicaid Other | Source: Home / Self Care | Attending: Emergency Medicine | Admitting: Emergency Medicine

## 2021-01-02 DIAGNOSIS — Z20822 Contact with and (suspected) exposure to covid-19: Secondary | ICD-10-CM | POA: Insufficient documentation

## 2021-01-02 DIAGNOSIS — G932 Benign intracranial hypertension: Secondary | ICD-10-CM

## 2021-01-02 DIAGNOSIS — R42 Dizziness and giddiness: Secondary | ICD-10-CM | POA: Insufficient documentation

## 2021-01-02 DIAGNOSIS — H538 Other visual disturbances: Secondary | ICD-10-CM | POA: Insufficient documentation

## 2021-01-02 DIAGNOSIS — R519 Headache, unspecified: Secondary | ICD-10-CM | POA: Insufficient documentation

## 2021-01-02 DIAGNOSIS — G529 Cranial nerve disorder, unspecified: Secondary | ICD-10-CM | POA: Insufficient documentation

## 2021-01-02 DIAGNOSIS — H539 Unspecified visual disturbance: Secondary | ICD-10-CM | POA: Insufficient documentation

## 2021-01-02 DIAGNOSIS — Z5321 Procedure and treatment not carried out due to patient leaving prior to being seen by health care provider: Secondary | ICD-10-CM | POA: Insufficient documentation

## 2021-01-02 DIAGNOSIS — I10 Essential (primary) hypertension: Secondary | ICD-10-CM

## 2021-01-02 NOTE — ED Triage Notes (Signed)
Pt sent from [facility] for further eval of severe headaches x 1 month and x 1 week has been having blurry vision and sts cannot move her right eye. Seen multiple times here for same. Taking prescribed medication without relief.

## 2021-01-02 NOTE — ED Provider Notes (Addendum)
Discussed with Dr. Dione Booze, whe sent in patient.  Has had a month of headaches but did come after fall.  Has had ER evaluations for same.  MRIs done.  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  Has severe disc edema also.  Worrisome for increased intracranial pressure.  Patient is reportedly morbidly obese.  Would be worrisome for central venous sinus thrombosis or potentially increased intracranial pressure either resulting from somewhere else or primary.  Will likely need MRV and lumbar puncture.  Potentially neurology consult.  Curbside consult with Dr. Roda Shutters from neurology.  Recommended CT venogram initially versus MRV.  Then lumbar puncture after if no abnormality   Benjiman Core, MD 01/02/21 1239    Benjiman Core, MD 01/02/21 1302

## 2021-01-02 NOTE — ED Notes (Signed)
Pt called several times in waiting room, no answer.

## 2021-01-02 NOTE — ED Notes (Signed)
Pt complained of chest pain. Took pt back did and an ekg. EKG looked normal. Put pt back in the waiting room.

## 2021-01-02 NOTE — ED Triage Notes (Signed)
Patient BIB [facility] from home. Patient having dizzy, nausea, vomiting, headaches for a month. New thing patient complaining about is blurred vision, patient went to eye doctor, they saw swelling behind her eyes, they told her to come to ED. Patient went to [facility], sat for 6 hours, left AMA because she was about to wait another 4 hours. Patient went home. At 6pm, patient got dizzy and had a syncopal episode. Patient ambulated to room without assistance.

## 2021-01-03 ENCOUNTER — Emergency Department (HOSPITAL_COMMUNITY): Payer: Medicaid Other

## 2021-01-03 LAB — PREGNANCY, URINE: Preg Test, Ur: NEGATIVE

## 2021-01-03 LAB — CBC WITH DIFFERENTIAL/PLATELET
Abs Immature Granulocytes: 0.02 10*3/uL (ref 0.00–0.07)
Basophils Absolute: 0.1 10*3/uL (ref 0.0–0.1)
Basophils Relative: 1 %
Eosinophils Absolute: 0.2 10*3/uL (ref 0.0–0.5)
Eosinophils Relative: 2 %
HCT: 36.9 % (ref 36.0–46.0)
Hemoglobin: 11.5 g/dL — ABNORMAL LOW (ref 12.0–15.0)
Immature Granulocytes: 0 %
Lymphocytes Relative: 32 %
Lymphs Abs: 2.9 10*3/uL (ref 0.7–4.0)
MCH: 25.9 pg — ABNORMAL LOW (ref 26.0–34.0)
MCHC: 31.2 g/dL (ref 30.0–36.0)
MCV: 83.1 fL (ref 80.0–100.0)
Monocytes Absolute: 0.7 10*3/uL (ref 0.1–1.0)
Monocytes Relative: 7 %
Neutro Abs: 5.3 10*3/uL (ref 1.7–7.7)
Neutrophils Relative %: 58 %
Platelets: 381 10*3/uL (ref 150–400)
RBC: 4.44 MIL/uL (ref 3.87–5.11)
RDW: 15.7 % — ABNORMAL HIGH (ref 11.5–15.5)
WBC: 9.1 10*3/uL (ref 4.0–10.5)
nRBC: 0 % (ref 0.0–0.2)

## 2021-01-03 LAB — BASIC METABOLIC PANEL
Anion gap: 10 (ref 5–15)
BUN: 11 mg/dL (ref 6–20)
CO2: 23 mmol/L (ref 22–32)
Calcium: 9.3 mg/dL (ref 8.9–10.3)
Chloride: 107 mmol/L (ref 98–111)
Creatinine, Ser: 0.63 mg/dL (ref 0.44–1.00)
GFR, Estimated: >60 mL/min (ref >60)
Glucose, Bld: 105 mg/dL — ABNORMAL HIGH (ref 70–99)
Potassium: 3.5 mmol/L (ref 3.5–5.1)
Sodium: 140 mmol/L (ref 135–145)

## 2021-01-03 LAB — PROTEIN, CSF: Total  Protein, CSF: 13 mg/dL — ABNORMAL LOW (ref 15–45)

## 2021-01-03 LAB — PROTIME-INR
INR: 1 (ref 0.8–1.2)
Prothrombin Time: 13.5 seconds (ref 11.4–15.2)

## 2021-01-03 LAB — SARS CORONAVIRUS 2 (TAT 6-24 HRS): SARS Coronavirus 2: NEGATIVE

## 2021-01-03 LAB — GLUCOSE, CSF: Glucose, CSF: 60 mg/dL (ref 40–70)

## 2021-01-03 MED ORDER — LIDOCAINE HCL (PF) 1 % IJ SOLN
INTRAMUSCULAR | Status: AC
  Filled 2021-01-03: qty 5

## 2021-01-03 MED ORDER — AMLODIPINE BESYLATE 5 MG PO TABS
5.0000 mg | ORAL_TABLET | Freq: Once | ORAL | Status: AC
  Administered 2021-01-03: 5 mg via ORAL
  Filled 2021-01-03: qty 1

## 2021-01-03 MED ORDER — ACETAZOLAMIDE 250 MG PO TABS: 500.0000 mg | ORAL_TABLET | Freq: Two times a day (BID) | ORAL | 0 refills | Status: DC

## 2021-01-03 MED ORDER — ACETAZOLAMIDE 250 MG PO TABS
500.0000 mg | ORAL_TABLET | Freq: Once | ORAL | Status: AC
  Administered 2021-01-03: 500 mg via ORAL
  Filled 2021-01-03: qty 2

## 2021-01-03 MED ORDER — MORPHINE SULFATE (PF) 4 MG/ML IV SOLN
4.0000 mg | Freq: Once | INTRAVENOUS | Status: AC
  Administered 2021-01-03: 4 mg via INTRAVENOUS
  Filled 2021-01-03: qty 1

## 2021-01-03 MED ORDER — SODIUM CHLORIDE 0.9 % IV BOLUS
500.0000 mL | Freq: Once | INTRAVENOUS | Status: AC
  Administered 2021-01-03: 500 mL via INTRAVENOUS

## 2021-01-03 MED ORDER — IOHEXOL 300 MG/ML  SOLN
100.0000 mL | Freq: Once | INTRAMUSCULAR | Status: AC | PRN
  Administered 2021-01-03: 75 mL via INTRAVENOUS

## 2021-01-03 MED ORDER — AMLODIPINE BESYLATE 5 MG PO TABS: 5.0000 mg | ORAL_TABLET | Freq: Every day | ORAL | 0 refills | Status: DC

## 2021-01-03 NOTE — Procedures (Signed)
Lumbar Puncture Procedure Note  Sheryl Monroe  [id number]  08-Aug-1996  Date:01/03/21  Time:10:17 AM   Provider Performing:Emanuelle Bastos Allena Katz   Procedure: Lumbar Puncture (53967)  Indication(s) Possible IIH  Consent Risks of the procedure as well as the alternatives and risks of each were explained to the patient and/or caregiver.  Consent for the procedure was obtained and is signed in the bedside chart  Anesthesia Topical only with 1% lidocaine    Time Out Verified patient identification, verified procedure, site/side was marked, verified correct patient position, special equipment/implants available, medications/allergies/relevant history reviewed, required imaging and test results available.   Sterile Technique Maximal sterile technique including sterile barrier drape, hand hygiene, sterile gown, sterile gloves, mask, hair covering.    Procedure Description See report in PACS. Opening pressure:36cm H2O. Closing pressure:19cm H2O. 17 mL CSF obtained.  Complications/Tolerance None; patient tolerated the procedure well.   EBL Minimal   Specimen(s) CSF

## 2021-01-03 NOTE — ED Provider Notes (Signed)
Patient was initially seen by Dr. Wilkie Aye.  Please see her note.  Plan was to follow-up on the patient's lumbar puncture.  It does show an elevated opening pressure at 35.  This is consistent with idiopathic intracranial hypertension.  Initial gram stain does not show any organism symptoms to suggest infection.  I did discuss these findings with Dr. Otelia Limes to get his recommendations.  He recommended starting the patient on Diamox.  He also recommends close follow-up with ophthalmology as well as neurology.  We also stressed the importance of dietary modification and referral to a bariatric surgical center for assistance in weight management.  He stressed the importance of compliance with this regimen or the patient could develop permanent vision loss.  I discussed all these findings with the patient as well as her husband.  She had been looking at weight loss management with her doctor.  It had not been successful initially.  She is interested in bariatric surgery consultation.  We did discuss the importance of diet modification and exercise to discuss with her primary care doctor, possible referral to dietitian.  Patient will follow up with neurology.  She will also follow-up with her ophthalmologist.  Patient was also noted to be hypertensive.  Looking back at the records this has been going on since 2021.  I will give her a prescription for Norvasc and requested she follow-up with her primary care doctor closely.   Linwood Dibbles, MD 01/03/21 [phone]

## 2021-01-03 NOTE — Discharge Instructions (Signed)
Please follow-up with a specialist as we discussed.  Also follow-up with your primary care doctor regarding your high blood pressure.

## 2021-01-03 NOTE — ED Notes (Signed)
Pt transported to IR via stretcher.  

## 2021-01-03 NOTE — ED Notes (Signed)
Pt back from IR 

## 2021-01-03 NOTE — ED Provider Notes (Addendum)
[facility]   CSN: [id number] Arrival date & time: 01/02/21  2209     History Chief Complaint  Patient presents with  . Dizziness    Sheryl Monroe is a 25 y.o. female.  HPI   25 year old morbidly obese female presents to the emergency department with concern for diffuse headache and vision change.  Patient has been evaluated for this headache previously.  CT/MRI did not show any specific findings.  She was referred as an outpatient was seen by ophthalmologist Dr. 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  There is concern for increased intracranial pressure and possible venous sinus thrombosis.  Patient states that her symptoms have been persistent and unchanged, no new symptoms.  No recent fever.  No neck pain or stiffness.  No rash.  Patient originally went to [facility] as instructed however states that her wait time was too long so she left AMA.  When she got home she had an episode where she felt dizzy and believes that she had a syncopal episode with loss of consciousness.  Past Medical History:  Diagnosis Date  . Bacterial vaginosis   . Chlamydia   . Gestational diabetes    with first pregnancy  . Morbid obesity with BMI of 50.0-59.9, adult [facility])     Patient Active Problem List   Diagnosis Date Noted  . Postpartum care following cesarean delivery 09/20/2018  . Encounter for elective induction of labor 09/17/2018  . Social isolation 09/01/2013    Past Surgical History:  Procedure Laterality Date  . CESAREAN SECTION WITH BILATERAL TUBAL LIGATION N/A 09/18/2018   Procedure: CESAREAN SECTION WITH BILATERAL TUBAL LIGATION;  Surgeon: Vena Austria, MD;  Location: [facility] ORS;  Service: Obstetrics;  Laterality: N/A;  . NO PAST SURGERIES       OB History    Gravida  5   Para  5   Term  5   Preterm  0   AB  0   Living  5     SAB  0   IAB  0   Ectopic  0    Multiple  0   Live Births  5           Family History  Problem Relation Age of Onset  . Hypertension Father   . Diabetes Paternal Grandmother   . Hypertension Paternal Grandmother   . Hypertension Maternal Grandmother   . Breast cancer Maternal Grandmother   . Hypertension Paternal Grandfather   . Diabetes Paternal Grandfather   . Hypothyroidism Maternal Aunt     Social History   Tobacco Use  . Smoking status: Never Smoker  . Smokeless tobacco: Never Used  Substance Use Topics  . Alcohol use: No  . Drug use: No    Home Medications Prior to Admission medications   Medication Sig Start Date End Date Taking? Authorizing Provider  fluticasone (FLONASE) 50 MCG/ACT nasal spray Place 1-2 sprays into both nostrils daily. Patient taking differently: Place 1-2 sprays into both nostrils daily as needed for allergies. 12/08/20   Wieters, Hallie C, PA-C  meloxicam (MOBIC) 15 MG tablet Take 1 tablet (15 mg total) by mouth daily. 12/16/20   Muthersbaugh, Dahlia Client, PA-C  methocarbamol (ROBAXIN) 500 MG tablet Take 1-2 tablets (500-1,000 mg total) by mouth 2 (two) times daily. Patient not taking: No sig reported 12/16/20   Muthersbaugh, Dahlia Client, PA-C  Cetirizine HCl 10 MG CAPS Take 1  capsule (10 mg total) by mouth daily. Patient not taking: No sig reported 12/08/20 12/16/20  Wieters, Fran Lowes C, PA-C    Allergies    Patient has no known allergies.  Review of Systems   Review of Systems  Constitutional: Positive for fatigue. Negative for chills and fever.  HENT: Negative for congestion.   Eyes: Positive for visual disturbance.  Respiratory: Negative for shortness of breath.   Cardiovascular: Negative for chest pain.  Gastrointestinal: Negative for abdominal pain, diarrhea and vomiting.  Genitourinary: Negative for dysuria.  Musculoskeletal: Negative for neck pain and neck stiffness.  Skin: Negative for rash.  Neurological: Positive for headaches. Negative for seizures.    Physical  Exam Updated Vital Signs BP (!) 166/71   Pulse 92   Temp 99.1 F (37.3 C) (Oral)   Resp 16   Ht 5\' 5"  (1.651 m)   Wt 127 kg   SpO2 99%   BMI 46.59 kg/m   Physical Exam Vitals and nursing note reviewed.  Constitutional:      Appearance: Normal appearance. She is obese.  HENT:     Head: Normocephalic.     Mouth/Throat:     Mouth: Mucous membranes are moist.  Eyes:     Comments: ocular palsies, pupils are reactive  Cardiovascular:     Rate and Rhythm: Normal rate.  Pulmonary:     Effort: Pulmonary effort is normal. No respiratory distress.  Abdominal:     Palpations: Abdomen is soft.     Tenderness: There is no abdominal tenderness.  Skin:    General: Skin is warm.  Neurological:     Mental Status: She is alert and oriented to person, place, and time.     Cranial Nerves: Cranial nerve deficit present.  Psychiatric:        Mood and Affect: Mood normal.     ED Results / Procedures / Treatments   Labs (all labs ordered are listed, but only abnormal results are displayed) Labs Reviewed  PREGNANCY, URINE  CBC WITH DIFFERENTIAL/PLATELET  BASIC METABOLIC PANEL    EKG None  Radiology No results found.  Procedures Procedures   Medications Ordered in ED Medications - No data to display  ED Course  I have reviewed the triage vital signs and the nursing notes.  Pertinent labs & imaging results that were available during my care of the patient were reviewed by me and considered in my medical decision making (see chart for details).    MDM Rules/Calculators/A&P                          25 year old female presents the emergency department ongoing headache, cranial nerve palsies.  Previous work-up included CT and MRI of the brain without significant findings.  She was seen by ophthalmology and found to have disc edema and sent in to rule out venous thrombosis.  CTV shows no thrombosis.  Consulted with neurology Dr. 30 who agrees with lumbar puncture.  This will  need to be done with IR given the patient's body habitus.  Neurology specifically wants no opening pressure.  Once this is done neurology can be reconsulted with the results of the lumbar puncture for further treatment.  Final Clinical Impression(s) / ED Diagnoses Final diagnoses:  None    Rx / DC Orders ED Discharge Orders    None       Wilford Corner, DO 01/03/21 0527    03/05/21, DO 01/03/21 [phone]

## 2021-01-03 NOTE — ED Notes (Signed)
Per radiology she should stay supine for 4 hours.

## 2021-01-06 LAB — CSF CULTURE W GRAM STAIN
Culture: NO GROWTH
Gram Stain: NONE SEEN

## 2021-02-08 ENCOUNTER — Ambulatory Visit
Admission: EM | Admit: 2021-02-08 | Discharge: 2021-02-08 | Disposition: A | Discharge status: Home / Self Care | Payer: Medicaid Other | Attending: Emergency Medicine | Admitting: Emergency Medicine

## 2021-02-08 ENCOUNTER — Encounter: Payer: Self-pay | Admitting: Emergency Medicine

## 2021-02-08 ENCOUNTER — Other Ambulatory Visit: Payer: Self-pay

## 2021-02-08 DIAGNOSIS — M545 Low back pain, unspecified: Secondary | ICD-10-CM

## 2021-02-08 DIAGNOSIS — M546 Pain in thoracic spine: Secondary | ICD-10-CM | POA: Diagnosis not present

## 2021-02-08 MED ORDER — TIZANIDINE HCL 2 MG PO TABS: 2.0000 mg | ORAL_TABLET | Freq: Four times a day (QID) | ORAL | 0 refills | Status: DC | PRN

## 2021-02-08 MED ORDER — IBUPROFEN 800 MG PO TABS: 800.0000 mg | ORAL_TABLET | Freq: Three times a day (TID) | ORAL | 0 refills | Status: DC

## 2021-02-08 NOTE — Discharge Instructions (Addendum)
Use anti-inflammatories for pain/swelling. You may take up to 800 mg Ibuprofen every 8 hours with food. You may supplement Ibuprofen with Tylenol [phone] mg every 8 hours.   You may use tizanidine as needed to help with pain. This is a muscle relaxer and causes sedation- please use only at bedtime or when you will be home and not have to drive/work  Alternate ice and heat Gentle stretching Follow-up if not improving or worsening
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
## 2021-04-01 ENCOUNTER — Ambulatory Visit
Admission: EM | Admit: 2021-04-01 | Discharge: 2021-04-01 | Disposition: A | Discharge status: Home / Self Care | Payer: Medicaid Other | Attending: Physician Assistant | Admitting: Physician Assistant

## 2021-04-01 ENCOUNTER — Other Ambulatory Visit: Payer: Self-pay

## 2021-04-01 DIAGNOSIS — Z113 Encounter for screening for infections with a predominantly sexual mode of transmission: Secondary | ICD-10-CM | POA: Diagnosis present

## 2021-04-01 DIAGNOSIS — I10 Essential (primary) hypertension: Secondary | ICD-10-CM | POA: Diagnosis not present

## 2021-04-01 LAB — POCT URINALYSIS DIP (MANUAL ENTRY)
Bilirubin, UA: NEGATIVE
Glucose, UA: NEGATIVE mg/dL
Ketones, POC UA: NEGATIVE mg/dL
Nitrite, UA: NEGATIVE
Protein Ur, POC: 30 mg/dL — AB
Spec Grav, UA: 1.025 (ref 1.010–1.025)
Urobilinogen, UA: 1 E.U./dL
pH, UA: 6 (ref 5.0–8.0)

## 2021-04-01 LAB — POCT URINE PREGNANCY: Preg Test, Ur: NEGATIVE

## 2021-04-01 MED ORDER — AMLODIPINE BESYLATE 5 MG PO TABS: 5.0000 mg | ORAL_TABLET | Freq: Every day | ORAL | 0 refills | Status: DC

## 2021-04-01 NOTE — Discharge Instructions (Addendum)
Will call with test results  Keep upcoming appointment with PCP Start Amlodipine

## 2021-04-01 NOTE — ED Provider Notes (Addendum)
[facility]    CSN: [id number] Arrival date & time: 04/01/21  1627      History   Chief Complaint Chief Complaint  Patient presents with   SEXUALLY TRANSMITTED DISEASE    HPI Sheryl Monroe is a 25 y.o. female.   Pt complains of lower abdominal pressure that occurs before she urinates.  She was seen 2 weeks ago, tested for gonorrhea and chlamydia.  Reports trichomonas was not sent out.  She reports a previous partner tested positive about 3 months ago, unsure if he completed treatment.  She denies vaginal discharge, burning, pain.  She would like to be tested for trichomonas today.    Blood pressure elevated today.  Pt denies chest pain, shortness of breath, headache, blurred vision, lower extremity swelling, palpitations.  She reports she has an appointment in about one week with PCP for evaluation of HTN.    Past Medical History:  Diagnosis Date   Bacterial vaginosis    Chlamydia    Gestational diabetes    with first pregnancy   Morbid obesity with BMI of 50.0-59.9, adult [facility])     Patient Active Problem List   Diagnosis Date Noted   Postpartum care following cesarean delivery 09/20/2018   Encounter for elective induction of labor 09/17/2018   Social isolation 09/01/2013    Past Surgical History:  Procedure Laterality Date   CESAREAN SECTION WITH BILATERAL TUBAL LIGATION N/A 09/18/2018   Procedure: CESAREAN SECTION WITH BILATERAL TUBAL LIGATION;  Surgeon: Vena Austria, MD;  Location: [facility] ORS;  Service: Obstetrics;  Laterality: N/A;   NO PAST SURGERIES      OB History     Gravida  5   Para  5   Term  5   Preterm  0   AB  0   Living  5      SAB  0   IAB  0   Ectopic  0   Multiple  0   Live Births  5            Home Medications    Prior to Admission medications   Medication Sig Start Date End Date Taking? Authorizing Provider  lisdexamfetamine (VYVANSE) 50 MG capsule Take 50 mg by mouth daily.   Yes [provider]  Vitamin D, Ergocalciferol, (DRISDOL) 1.25 MG (50000 UNIT) CAPS capsule Take 50,000 Units by mouth every 7 (seven) days. Wednesday    [provider]  Cetirizine HCl 10 MG CAPS Take 1 capsule (10 mg total) by mouth daily. Patient not taking: No sig reported 12/08/20 12/16/20  Wieters, Junius Creamer, PA-C    Family History Family History  Problem Relation Age of Onset   Hypertension Father    Diabetes Paternal Grandmother    Hypertension Paternal Grandmother    Hypertension Maternal Grandmother    Breast cancer Maternal Grandmother    Hypertension Paternal Grandfather    Diabetes Paternal Grandfather    Hypothyroidism Maternal Aunt     Social History Social History   Tobacco Use   Smoking status: Never   Smokeless tobacco: Never  Substance Use Topics   Alcohol use: No   Drug use: No     Allergies   Patient has no known allergies.   Review of Systems Review of Systems  Constitutional:  Negative for chills and fever.  HENT:  Negative for ear pain and sore throat.   Eyes:  Negative for pain and visual disturbance.  Respiratory:  Negative for cough and shortness  of breath.   Cardiovascular:  Negative for chest pain and palpitations.  Gastrointestinal:  Positive for abdominal pain. Negative for vomiting.  Genitourinary:  Negative for dysuria, hematuria, pelvic pain, urgency, vaginal bleeding, vaginal discharge and vaginal pain.  Musculoskeletal:  Negative for arthralgias and back pain.  Skin:  Negative for color change and rash.  Neurological:  Negative for seizures and syncope.  All other systems reviewed and are negative.   Physical Exam Triage Vital Signs ED Triage Vitals  Enc Vitals Group     BP 04/01/21 1659 (!) 182/116     Pulse Rate 04/01/21 1659 (!) 106     Resp 04/01/21 1659 18     Temp 04/01/21 1659 98.6 F (37 C)     Temp Source 04/01/21 1659 Oral     SpO2 04/01/21 1659 98 %     Weight --      Height --      Head Circumference --       Peak Flow --      Pain Score 04/01/21 1700 6     Pain Loc --      Pain Edu? --      Excl. in GC? --    No data found.  Updated Vital Signs BP (!) 182/116 (BP Location: Left Arm)   Pulse (!) 106   Temp 98.6 F (37 C) (Oral)   Resp 18   LMP 03/12/2021   SpO2 98%   Visual Acuity Right Eye Distance:   Left Eye Distance:   Bilateral Distance:    Right Eye Near:   Left Eye Near:    Bilateral Near:     Physical Exam Vitals and nursing note reviewed.  Constitutional:      General: She is not in acute distress.    Appearance: She is well-developed.  HENT:     Head: Normocephalic and atraumatic.  Eyes:     Conjunctiva/sclera: Conjunctivae normal.  Cardiovascular:     Rate and Rhythm: Normal rate and regular rhythm.     Heart sounds: No murmur heard. Pulmonary:     Effort: Pulmonary effort is normal. No respiratory distress.     Breath sounds: Normal breath sounds.  Abdominal:     Palpations: Abdomen is soft.     Tenderness: There is no abdominal tenderness.  Musculoskeletal:     Cervical back: Neck supple.  Skin:    General: Skin is warm and dry.  Neurological:     Mental Status: She is alert.     UC Treatments / Results  Labs (all labs ordered are listed, but only abnormal results are displayed) Labs Reviewed  POCT URINALYSIS DIP (MANUAL ENTRY) - Abnormal; Notable for the following components:      Result Value   Clarity, UA cloudy (*)    Blood, UA large (*)    Protein Ur, POC =30 (*)    Leukocytes, UA Small (1+) (*)    All other components within normal limits  POCT URINE PREGNANCY  CERVICOVAGINAL ANCILLARY ONLY    EKG   Radiology No results found.  Procedures Procedures (including critical care time)  Medications Ordered in UC Medications - No data to display  Initial Impression / Assessment and Plan / UC Course  I have reviewed the triage vital signs and the nursing notes.  Pertinent labs & imaging results that were available during my  care of the patient were reviewed by me and considered in my medical decision making (see chart for details).  UA normal.  Will send out test for trichomonas and treat if indicated.   Elevated BP today in clinic.  She reports she has an upcoming appointment with PCP for evaluation.  Based on last three readings will start Amlodipine 5mg  today.  She will keep follow up with PCP for further management.  Return precautions discussed.   BP Readings from Last 3 Encounters:  04/01/21 (!) 182/116  02/08/21 (!) 146/102  01/03/21 (!) 139/104    Final Clinical Impressions(s) / UC Diagnoses   Final diagnoses:  None   Discharge Instructions   None    ED Prescriptions   None    PDMP not reviewed this encounter.   03/05/21, PA-C 04/01/21 1721    06/01/21, PA-C 04/01/21 1756

## 2021-04-01 NOTE — ED Triage Notes (Signed)
Pt c/o lower abdominal pressure prior to having to urinate. States seen at [facility] and had neg gc/ chlamydia and gonorrhea but wasn't test for Trich. States hx of Trich last year.   Pt c/o lt knee pain off and on x1 month. Denies injury.

## 2021-04-02 ENCOUNTER — Telehealth (HOSPITAL_COMMUNITY): Payer: Self-pay | Admitting: Emergency Medicine

## 2021-04-02 LAB — CERVICOVAGINAL ANCILLARY ONLY
Bacterial Vaginitis (gardnerella): POSITIVE — AB
Candida Glabrata: NEGATIVE
Candida Vaginitis: NEGATIVE
Chlamydia: NEGATIVE
Comment: NEGATIVE
Comment: NEGATIVE
Comment: NEGATIVE
Comment: NEGATIVE
Comment: NEGATIVE
Comment: NORMAL
Neisseria Gonorrhea: NEGATIVE
Trichomonas: NEGATIVE

## 2021-04-02 MED ORDER — METRONIDAZOLE 500 MG PO TABS
500.0000 mg | ORAL_TABLET | Freq: Two times a day (BID) | ORAL | 0 refills | Status: DC
Start: 2021-04-02 — End: 2024-04-23

## 2021-08-15 ENCOUNTER — Institutional Professional Consult (permissible substitution) (HOSPITAL_BASED_OUTPATIENT_CLINIC_OR_DEPARTMENT_OTHER): Payer: Medicaid Other | Admitting: Obstetrics & Gynecology

## 2022-01-06 IMAGING — CT CT HEAD W/O CM
3 series · 15 of 47 positions shown, 18 images · non-contrast
Comparison: October 20, 2019

CLINICAL DATA: Diplopia

EXAM:
CT HEAD WITHOUT CONTRAST
TECHNIQUE: Contiguous axial images were obtained from the base of the skull
through the vertex without intravenous contrast.

[Series 4: head 5.0 h30s · axial · 0.47mm/px · z∈[-127,+3]mm · 9 of 32 slices shown, 12 images]
[im 3/32  brain]
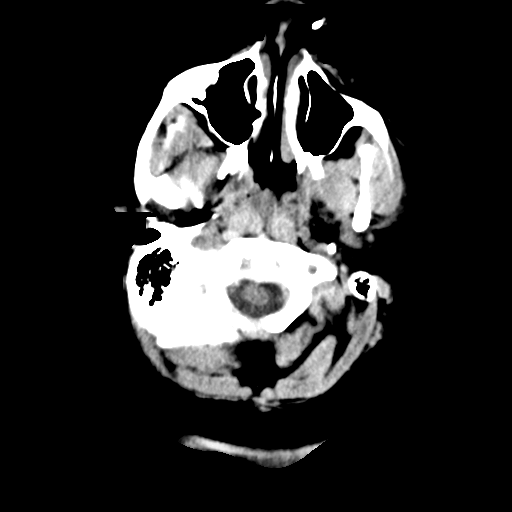
[im 3/32  bone]
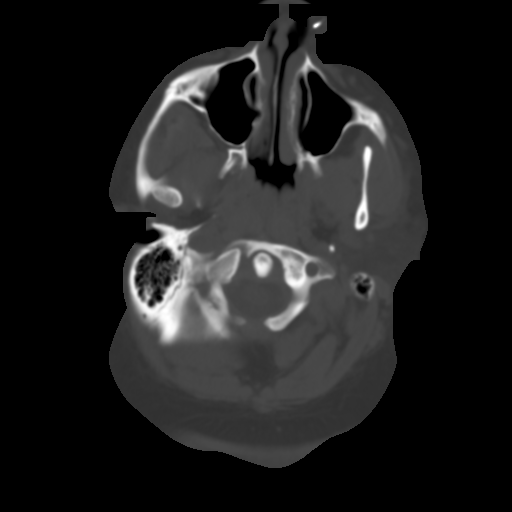
[im 6/32  brain]
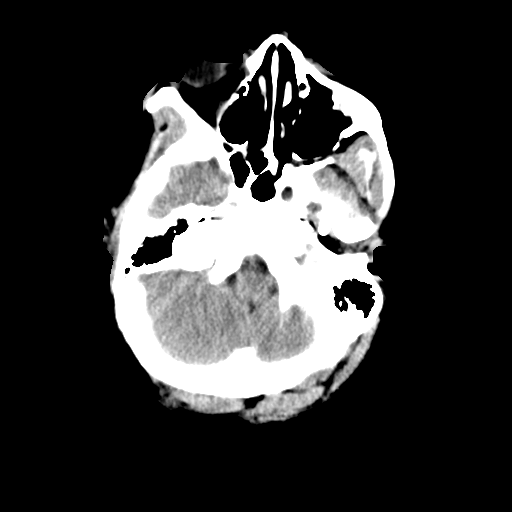
[im 9/32  brain]
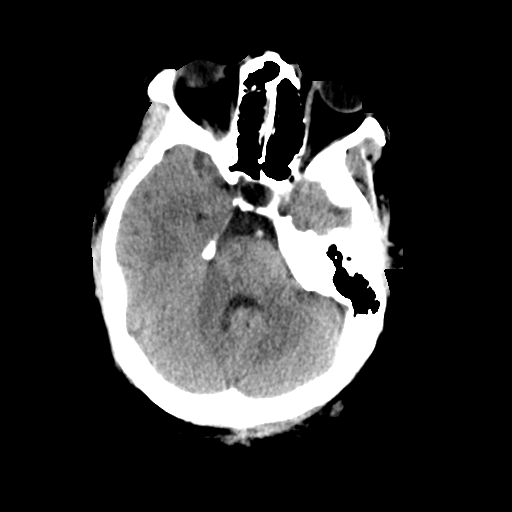
[im 12/32  brain]
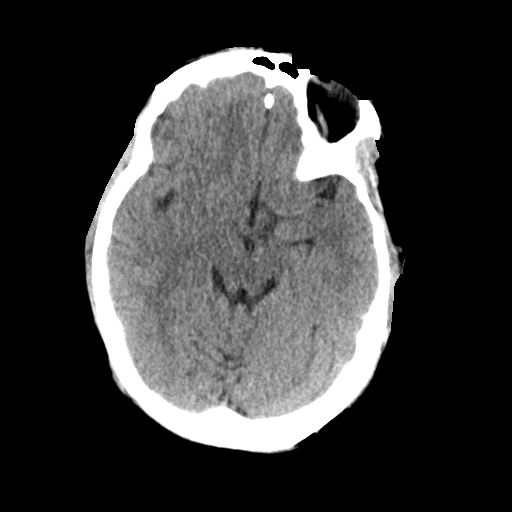
[im 17/32  brain]
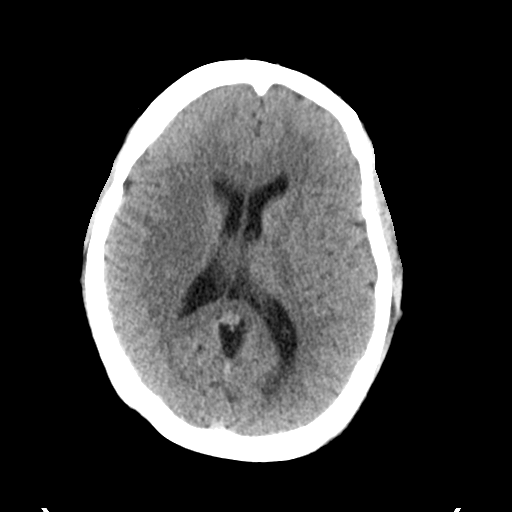
[im 17/32  bone]
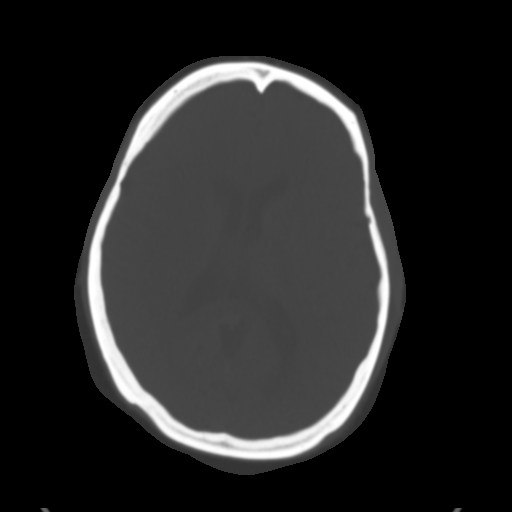
[im 20/32  brain]
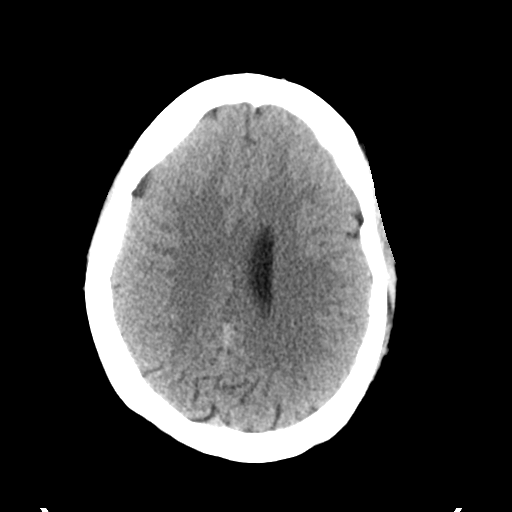
[im 23/32  brain]
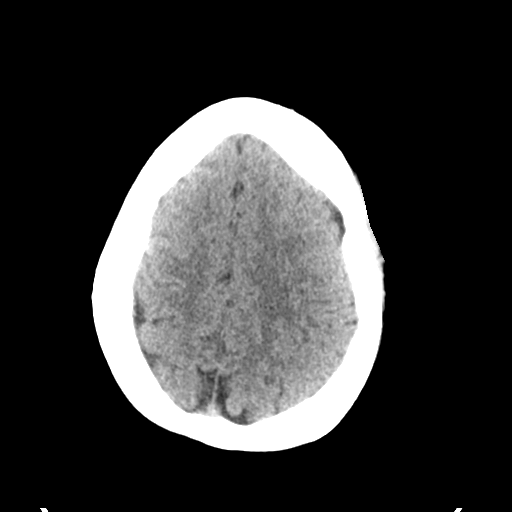
[im 26/32  brain]
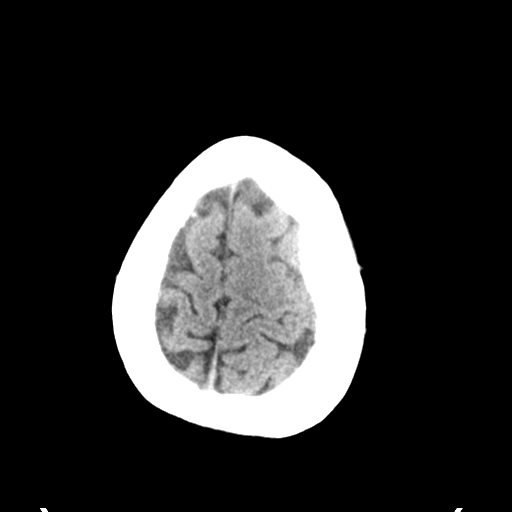
[im 29/32  brain]
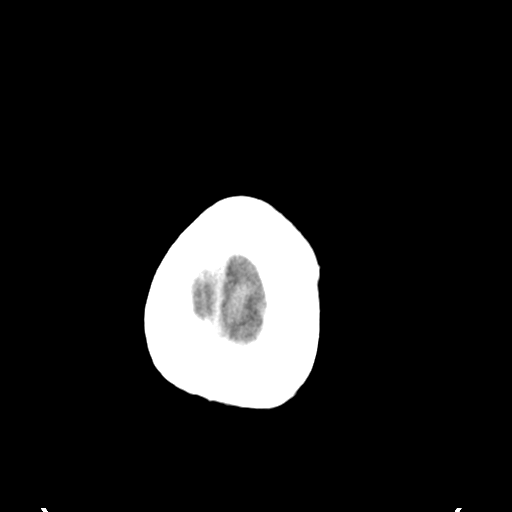
[im 29/32  bone]
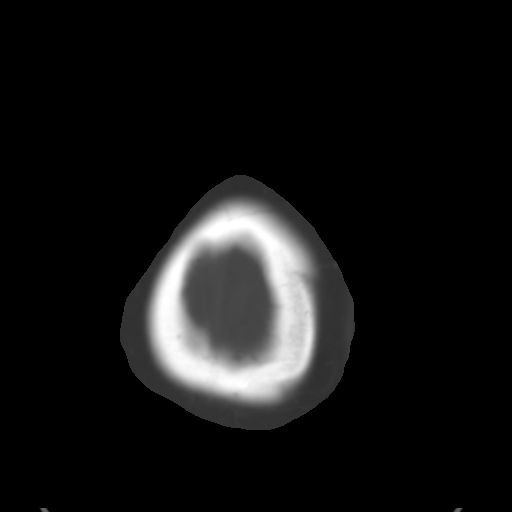

[Series 5: head 3.0 mpr cor · coronal · 0.33mm/px · 3 of 80 slices shown]
[im 27/80  brain]
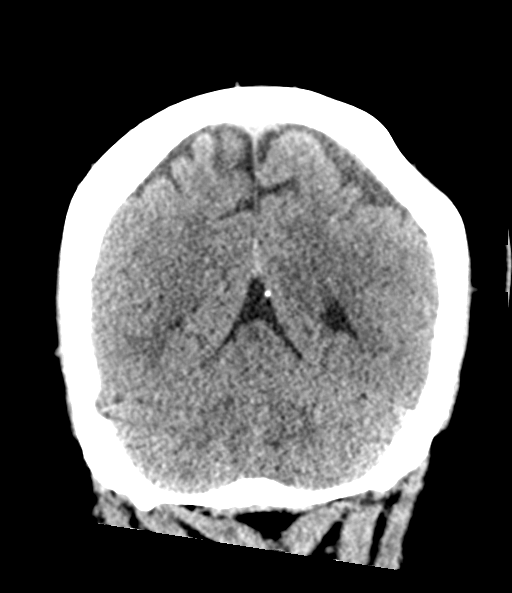
[im 36/80  brain]
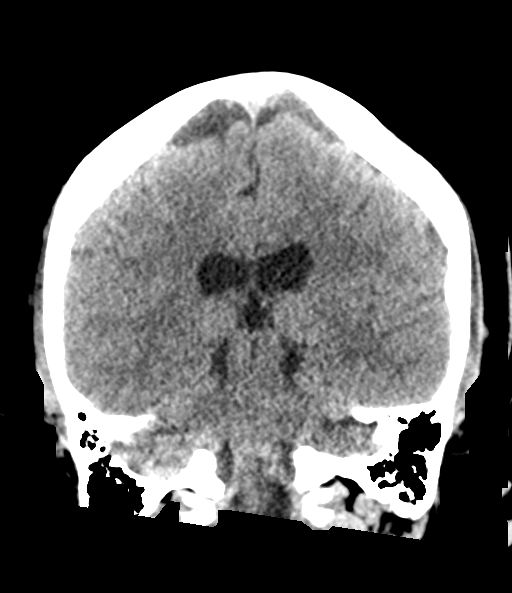
[im 44/80  brain]
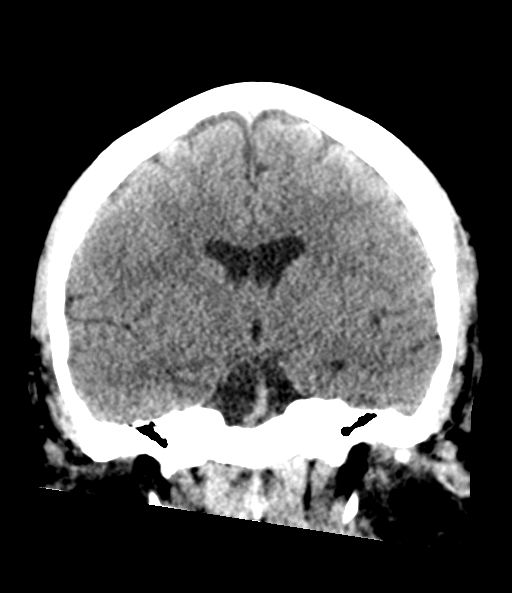

[Series 6: head 3.0 mpr sag · sagittal · 0.31mm/px · 3 of 67 slices shown]
[im 25/67  brain]
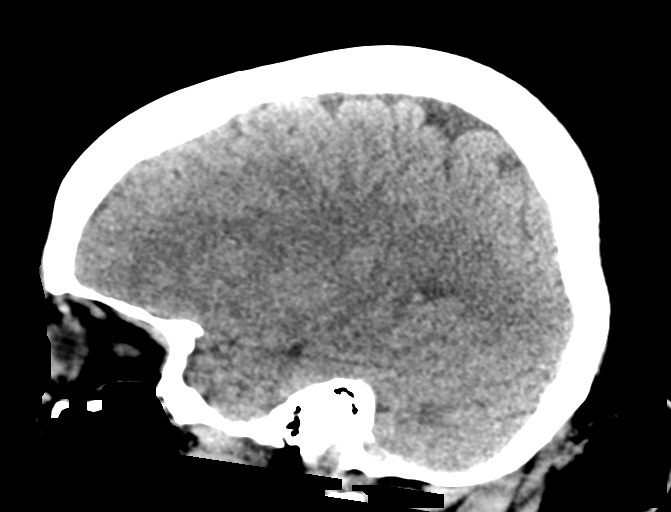
[im 34/67  brain]
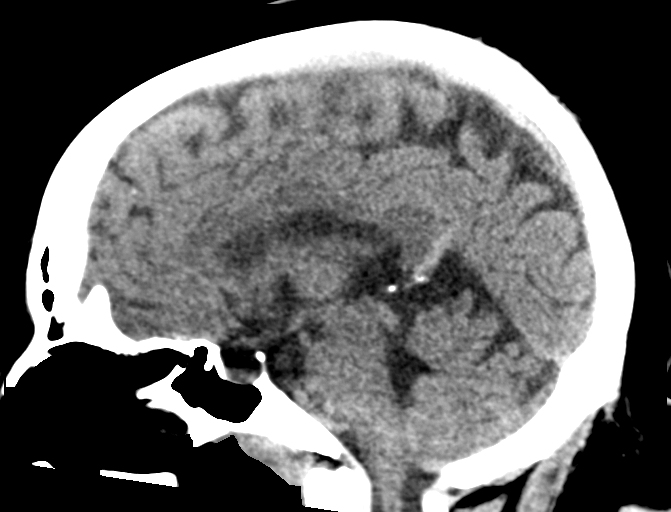
[im 43/67  brain]
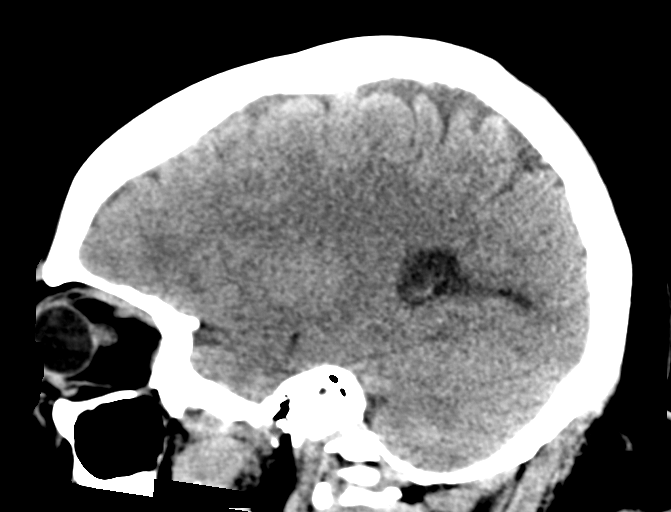

[15 of 47 positions shown; findings below may reference images not displayed]

FINDINGS: Brain: Ventricles and sulci are normal in size and configuration.
There is no intracranial mass, hemorrhage, extra-axial fluid
collection, or midline shift. The brain parenchyma appears
unremarkable. No evident acute infarct.

Vascular: No hyperdense vessel.  No evident vascular calcification.

Skull: The bony calvarium appears intact.

Sinuses/Orbits: Visualized paranasal sinuses are clear. Orbits
appear symmetric bilaterally.

Other: Visualized mastoid air cells are clear.
IMPRESSION: Study within normal limits.

## 2022-01-06 IMAGING — MR MR HEAD WO/W CM
9 of 23 series · 16 of 48 positions shown · IV contrast (gadavist)
Comparison: CT head October 20, 2019.

CLINICAL DATA: Headache, new or worsening. Concern for multiple
sclerosis.

EXAM:
MRI HEAD WITHOUT AND WITH CONTRAST
MRI CERVICAL SPINE WITHOUT AND WITH CONTRAST
TECHNIQUE: Multiplanar, multiecho pulse sequences of the brain and surrounding
structures were obtained without and with intravenous contrast.
Multiplanar and multiecho pulse sequences of the cervical spine, to
include the craniocervical junction and cervicothoracic junction,
were obtained without and with intravenous contrast.
CONTRAST:  10mL GADAVIST GADOBUTROL 1 MMOL/ML IV SOLN

[Series 2: DWI · axial · 3.0mm · 0.94mm/px · z∈[-89,+48]mm · 2 of 95 slices shown (1 of 2)]
[im 1/95]
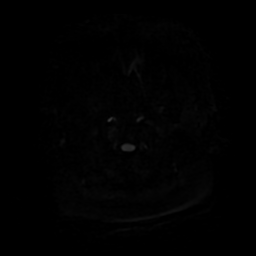
[im 95/95]
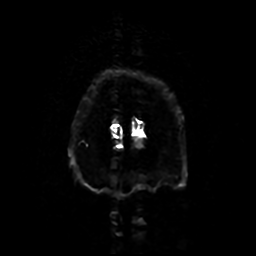

[Series 3: DWI · coronal · 4.0mm · 0.94mm/px · 1 of 70 slices shown (2 of 2)]
[im 1/70]
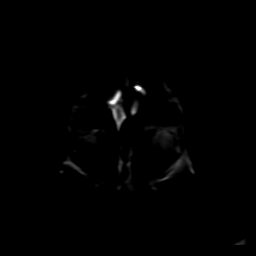

[Series 4: FLAIR · sagittal · 5.0mm · 0.23mm/px · 1 of 23 slices shown (1 of 4)]
[im 1/23]
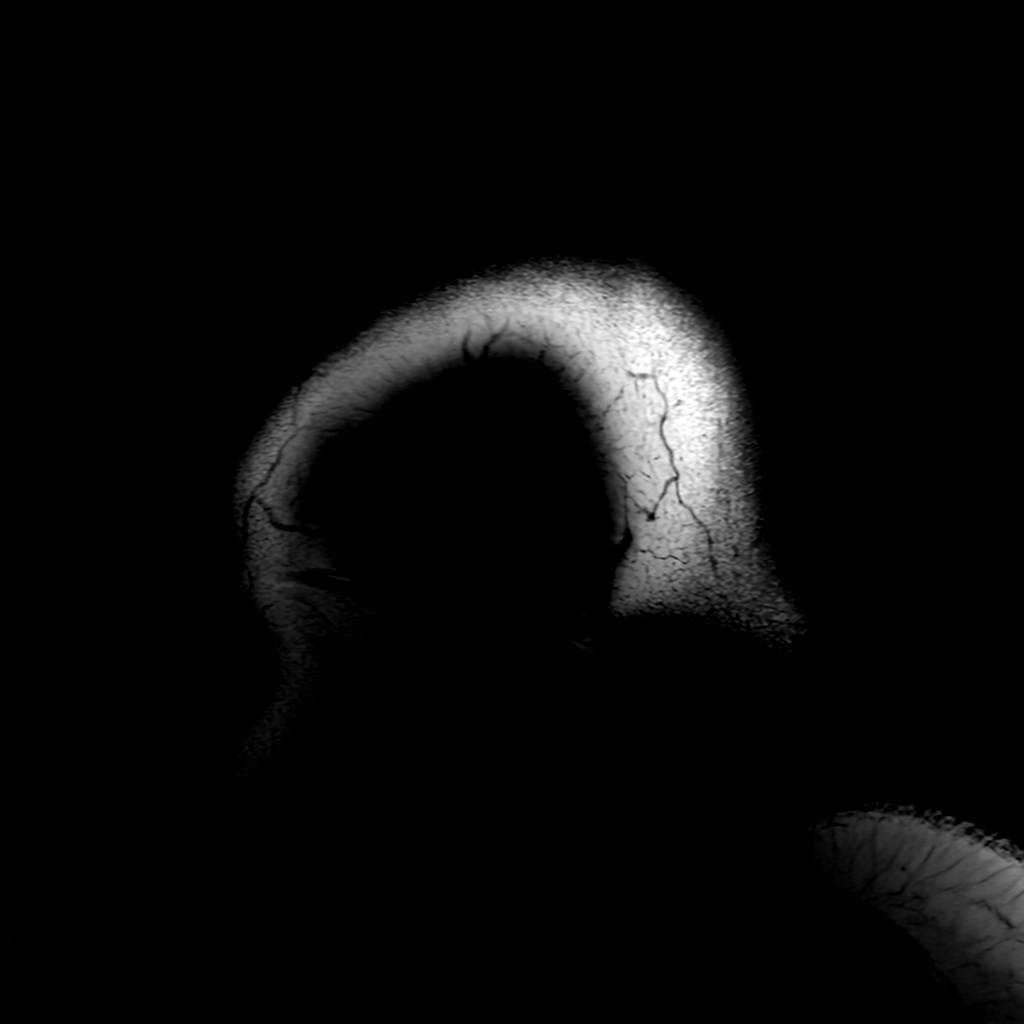

[Series 6: FLAIR · axial · 3.0mm · 0.45mm/px · 1 of 23 slices shown (2 of 4)]
[im 1/23]
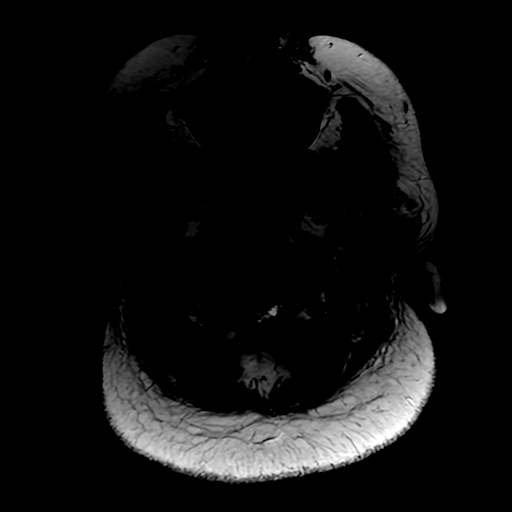

[Series 9: FLAIR · sagittal · 1.6mm · 0.49mm/px · 7 of 216 slices shown (3 of 4)]
[im 1/216]
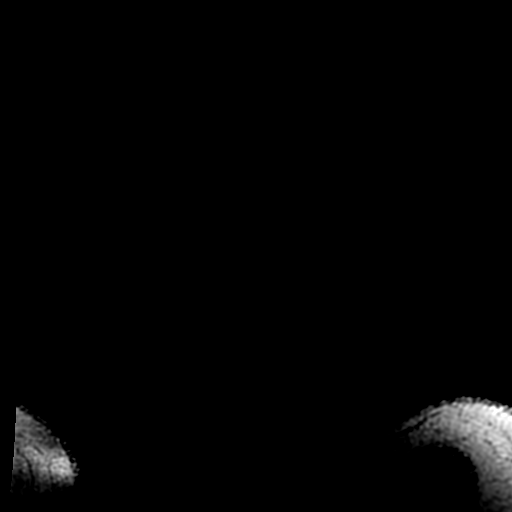
[im 36/216]
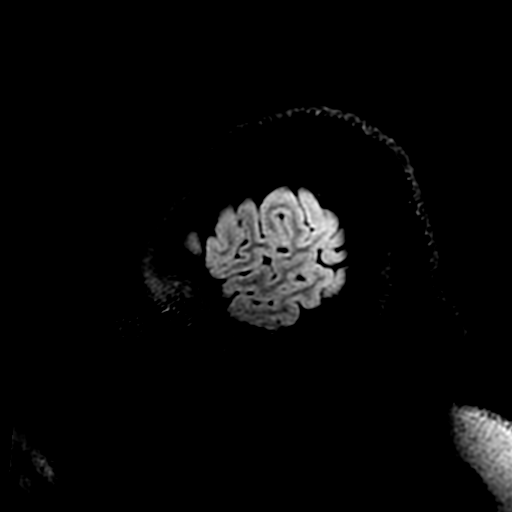
[im 72/216]
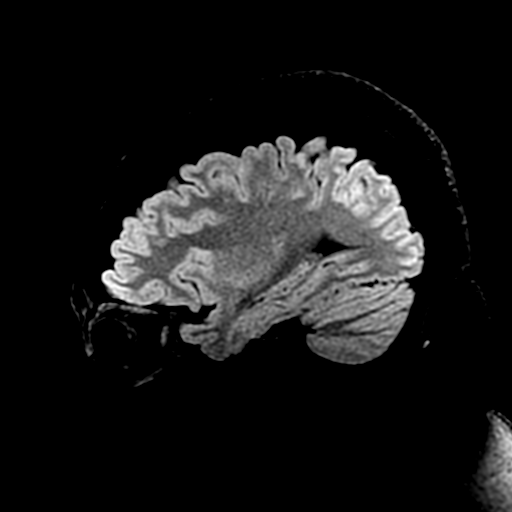
[im 108/216]
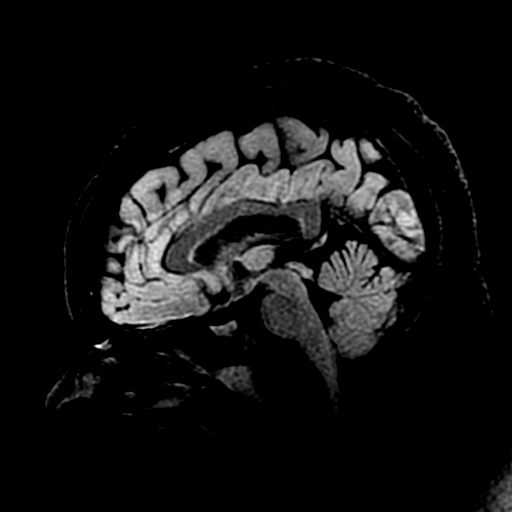
[im 144/216]
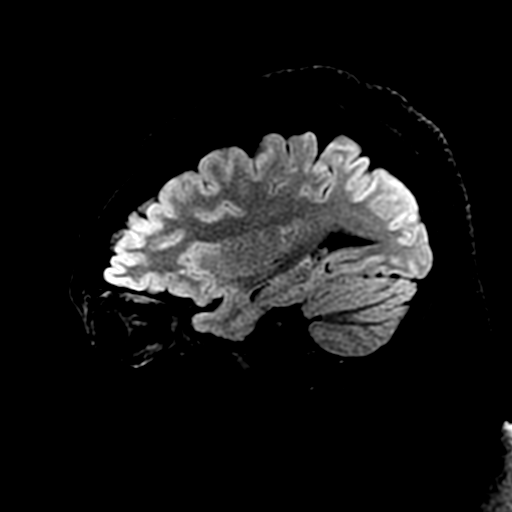
[im 180/216]
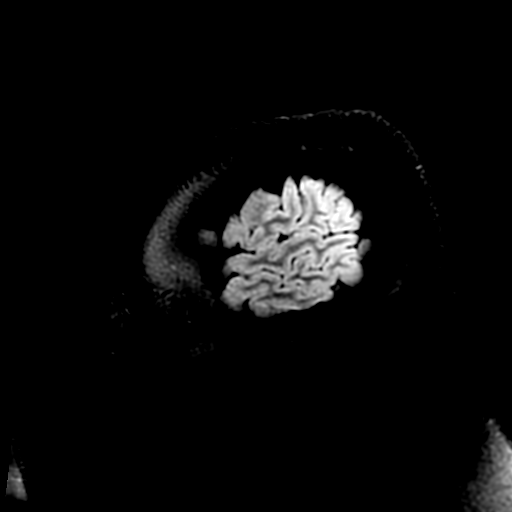
[im 216/216]
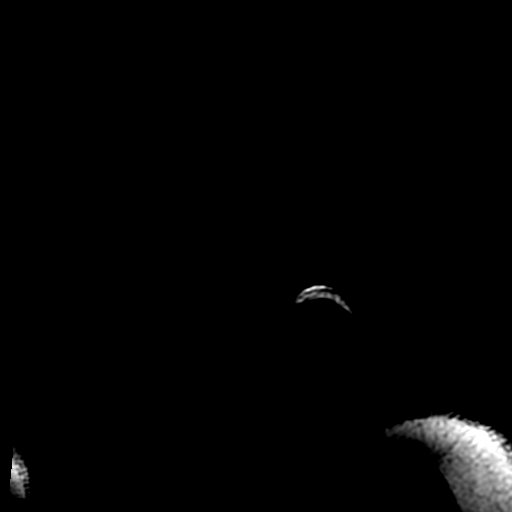

[Series 13: FLAIR · sagittal · 3.0mm · 0.43mm/px · 1 of 17 slices shown (4 of 4)]
[im 1/17]
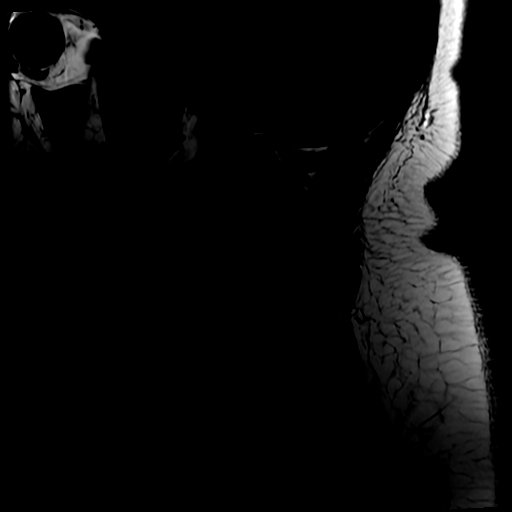

[Series 14: T1 post-contrast · axial · 3.0mm · 0.35mm/px · 1 of 31 slices shown]
[im 1/31]
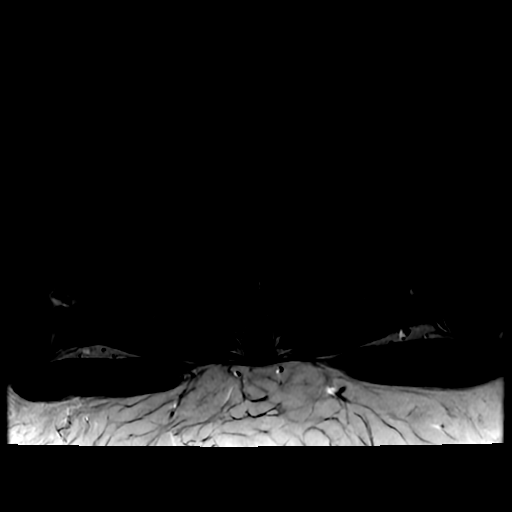

[Series 250: ADC · axial · 3.0mm · 0.94mm/px · 1 of 48 slices shown (1 of 2)]
[im 1/48]
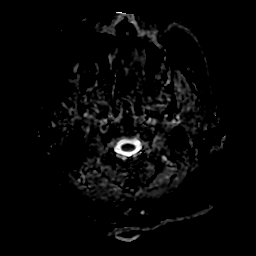

[Series 350: ADC · coronal · 4.0mm · 0.94mm/px · 1 of 35 slices shown (2 of 2)]
[im 1/35]
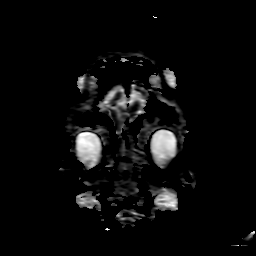

[16 of 48 positions shown; findings below may reference images not displayed]

FINDINGS: HEAD:

Brain: No acute infarction, hemorrhage, hydrocephalus, extra-axial
collection or mass lesion. Scattered small T2/FLAIR hyperintensities
predominantly in the juxtacortical white matter, which are mildly
advanced in number for age. No abnormal enhancement.

Vascular: Major arterial flow voids are maintained skull base.

Skull and upper cervical spine: Diffuse T1 hypointensity of the
marrow. No focal marrow replacing lesion.

Sinuses/Orbits: Largely clear sinuses.  Unremarkable orbits.

Other: No mastoid effusions.  Prominent tonsils and adenoids.

CERVICAL SPINE:

Alignment: Straightening of the normal cervical lordosis. No
substantial sagittal subluxation.

Vertebrae: Diffuse T1 hypointensity of the marrow.

Cord: No convincing abnormal cord signal. No normal cord
enhancement.

Posterior Fossa, vertebral arteries, paraspinal tissues: Visualized
vertebral artery flow voids are maintained. Posterior fossa is
better characterized on same day MRI head

Disc levels: No significant disc protrusion, foraminal stenosis, or
canal stenosis. Small posterior disc osteophyte complex at C5-C6.
IMPRESSION: MRI head:

1. No acute intracranial abnormality.
2. Mild scattered small T2/FLAIR hyperintensities predominantly in
the juxtacortical white matter, which are mildly advanced in number
for age. This finding is nonspecific but can be seen in the setting
of chronic microvascular ischemia, a demyelinating process such as
multiple sclerosis, chronic migraines, or as the sequelae of prior
inflammation. No abnormal enhancement.
3. Prominent tonsils and adenoids, most likely related to
hypertrophy. Recommend correlation with direct inspection.

MRI cervical spine:

1. No convincing cord signal abnormality or enhancement.
2. Diffuse T1 hypointensity of the marrow, which is nonspecific but
can be seen with chronic anemia, chronic hypoxia (such as in
smokers), or lymphoproliferative disorders.
3. No significant canal or foraminal stenosis.

## 2023-04-25 ENCOUNTER — Emergency Department (HOSPITAL_BASED_OUTPATIENT_CLINIC_OR_DEPARTMENT_OTHER)
Admission: EM | Admit: 2023-04-25 | Discharge: 2023-04-25 | Disposition: A | Discharge status: Home / Self Care | Payer: Medicaid Other | Attending: Emergency Medicine | Admitting: Emergency Medicine

## 2023-04-25 ENCOUNTER — Encounter (HOSPITAL_BASED_OUTPATIENT_CLINIC_OR_DEPARTMENT_OTHER): Payer: Self-pay | Admitting: Emergency Medicine

## 2023-04-25 ENCOUNTER — Other Ambulatory Visit: Payer: Self-pay

## 2023-04-25 DIAGNOSIS — N611 Abscess of the breast and nipple: Secondary | ICD-10-CM | POA: Insufficient documentation

## 2023-04-25 DIAGNOSIS — N644 Mastodynia: Secondary | ICD-10-CM | POA: Diagnosis present

## 2023-04-25 LAB — PREGNANCY, URINE: Preg Test, Ur: NEGATIVE

## 2023-04-25 MED ORDER — IBUPROFEN 400 MG PO TABS
600.0000 mg | ORAL_TABLET | Freq: Once | ORAL | Status: AC
  Administered 2023-04-25: 600 mg via ORAL
  Filled 2023-04-25: qty 1

## 2023-04-25 MED ORDER — AMOXICILLIN-POT CLAVULANATE 875-125 MG PO TABS
1.0000 | ORAL_TABLET | Freq: Once | ORAL | Status: AC
  Administered 2023-04-25: 1 via ORAL
  Filled 2023-04-25: qty 1

## 2023-04-25 MED ORDER — AMOXICILLIN-POT CLAVULANATE 875-125 MG PO TABS: 1.0000 | ORAL_TABLET | Freq: Two times a day (BID) | ORAL | 0 refills | Status: DC

## 2023-04-25 NOTE — Discharge Instructions (Signed)
Continue to use warm compresses on your breast.  We are also prescribing you 10 days of antibiotics.  Follow-up with the breast center for potential ultrasound imaging and/or treatment depending on the findings.  You develop fever, chills, new or worsening pain or swelling, or any other new/concerning symptoms then return to the ER or call 911.

## 2023-04-25 NOTE — ED Triage Notes (Signed)
Patient arrived via POV c/o abscess to right breast with pain in right arm x 4 days. Patient states getting worse over time. Now site is red and hard. Patient has family hx of breast cancer. Patient is AO x 4, VS w/ elevated BP, normal gait.

## 2023-04-25 NOTE — ED Provider Notes (Signed)
[place] [facility] Provider Note   CSN: [id number] Arrival date & time: 04/25/23  1955     History  Chief Complaint  Patient presents with   Abscess    Sheryl Monroe is a 27 y.o. female.  HPI 28 year old female presents with concern for breast abscess.  About 5 days ago she noticed some bilateral nipple discomfort.  That has gone away but she noticed what seemed like a pimple near her right nipple and then now has noticed some progressive swelling next to her right nipple as well.  It is painful.  She has had a little bit of drainage.  She feels like her right arm is sore but she thinks that is from the way she is holding her arm due to the pain in her breast.  No fevers.  She took a negative pregnancy test within the last few days and does not think she is pregnant.  Home Medications Prior to Admission medications   Medication Sig Start Date End Date Taking? Authorizing Provider  amoxicillin-clavulanate (AUGMENTIN) 875-125 MG tablet Take 1 tablet by mouth every 12 (twelve) hours. 04/25/23  Yes Pricilla Loveless, MD  amLODipine (NORVASC) 5 MG tablet Take 1 tablet (5 mg total) by mouth daily. 04/01/21   Ward, Tylene Fantasia, PA-C  lisdexamfetamine (VYVANSE) 50 MG capsule Take 50 mg by mouth daily.    [provider]  metroNIDAZOLE (FLAGYL) 500 MG tablet Take 1 tablet (500 mg total) by mouth 2 (two) times daily. 04/02/21   LampteyBritta Mccreedy, MD  Vitamin D, Ergocalciferol, (DRISDOL) 1.25 MG (50000 UNIT) CAPS capsule Take 50,000 Units by mouth every 7 (seven) days. Wednesday    [provider]  Cetirizine HCl 10 MG CAPS Take 1 capsule (10 mg total) by mouth daily. Patient not taking: No sig reported 12/08/20 12/16/20  Wieters, Fran Lowes C, PA-C      Allergies    Patient has no known allergies.    Review of Systems   Review of Systems  Constitutional:  Negative for fever.  Skin:  Positive for wound.    Physical Exam Updated Vital  Signs BP (!) 149/115 (BP Location: Left Arm)   Pulse 92   Temp 98.1 F (36.7 C) (Oral)   Resp 17   Ht 5\' 5"  (1.651 m)   Wt (!) 140.6 kg   LMP 04/03/2023 (Exact Date)   SpO2 99%   BMI 51.59 kg/m  Physical Exam Vitals and nursing note reviewed. Exam conducted with a chaperone present.  Constitutional:      Appearance: She is well-developed. She is obese.  HENT:     Head: Normocephalic and atraumatic.  Pulmonary:     Effort: Pulmonary effort is normal.  Chest:  Breasts:    Right: Swelling and skin change present. No nipple discharge.     Left: No nipple discharge.    Skin:    General: Skin is warm and dry.  Neurological:     Mental Status: She is alert.     ED Results / Procedures / Treatments   Labs (all labs ordered are listed, but only abnormal results are displayed) Labs Reviewed  PREGNANCY, URINE    EKG None  Radiology No results found.  Procedures Procedures    Medications Ordered in ED Medications  ibuprofen (ADVIL) tablet 600 mg (600 mg Oral Given 04/25/23 2118)  amoxicillin-clavulanate (AUGMENTIN) 875-125 MG per tablet 1 tablet (1 tablet Oral Given 04/25/23 2206)    ED Course/ Medical  Decision Making/ A&P                                 Medical Decision Making Amount and/or Complexity of Data Reviewed Labs: ordered. Radiology: ordered.  Risk Prescription drug management.   Patient's exam seems to show a small abscess on her areola.  However it seems like it is at least partially drained per both patient report and on exam.  I do not think incision and drainage is needed now and she is showing no signs or symptoms of deeper infection or an systemic infection.  Will discharge with Augmentin, continue warm compresses.  Pregnancy test is negative.  Given ibuprofen for pain.  Will refer to the breast center.        Final Clinical Impression(s) / ED Diagnoses Final diagnoses:  Breast abscess    Rx / DC Orders ED Discharge Orders           Ordered    US BREAST COMPLETE UNI RIGHT INC AXILLA        04/25/23 2201    amoxicillin-clavulanate (AUGMENTIN) 875-125 MG tablet  Every 12 hours        04/25/23 2201              Pricilla Loveless, MD 04/25/23 2213

## 2023-04-27 ENCOUNTER — Other Ambulatory Visit: Payer: Self-pay | Admitting: Physician Assistant

## 2023-04-27 DIAGNOSIS — N611 Abscess of the breast and nipple: Secondary | ICD-10-CM

## 2023-04-30 ENCOUNTER — Other Ambulatory Visit: Payer: Medicaid Other

## 2023-05-05 ENCOUNTER — Other Ambulatory Visit: Payer: Medicaid Other
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
## 2023-05-14 ENCOUNTER — Other Ambulatory Visit: Payer: Medicaid Other

## 2023-07-03 ENCOUNTER — Encounter (HOSPITAL_COMMUNITY): Payer: Self-pay | Admitting: Emergency Medicine

## 2023-07-03 ENCOUNTER — Emergency Department (HOSPITAL_COMMUNITY)
Admission: EM | Admit: 2023-07-03 | Discharge: 2023-07-03 | Disposition: A | Discharge status: Home / Self Care | Payer: Medicaid Other | Attending: Emergency Medicine | Admitting: Emergency Medicine

## 2023-07-03 ENCOUNTER — Emergency Department (HOSPITAL_COMMUNITY): Payer: Medicaid Other

## 2023-07-03 ENCOUNTER — Other Ambulatory Visit: Payer: Self-pay

## 2023-07-03 DIAGNOSIS — T148XXA Other injury of unspecified body region, initial encounter: Secondary | ICD-10-CM

## 2023-07-03 DIAGNOSIS — M542 Cervicalgia: Secondary | ICD-10-CM | POA: Diagnosis not present

## 2023-07-03 DIAGNOSIS — R202 Paresthesia of skin: Secondary | ICD-10-CM | POA: Insufficient documentation

## 2023-07-03 DIAGNOSIS — S0285XA Fracture of orbit, unspecified, initial encounter for closed fracture: Secondary | ICD-10-CM | POA: Insufficient documentation

## 2023-07-03 DIAGNOSIS — S0990XA Unspecified injury of head, initial encounter: Secondary | ICD-10-CM

## 2023-07-03 MED ORDER — OXYCODONE-ACETAMINOPHEN 5-325 MG PO TABS: 1.0000 | ORAL_TABLET | ORAL | 0 refills | Status: DC | PRN

## 2023-07-03 MED ORDER — ONDANSETRON 4 MG PO TBDP
4.0000 mg | ORAL_TABLET | Freq: Once | ORAL | Status: AC
  Filled 2023-07-03: qty 1

## 2023-07-03 MED ORDER — FLUORESCEIN SODIUM 1 MG OP STRP
1.0000 | ORAL_STRIP | Freq: Once | OPHTHALMIC | Status: AC
  Administered 2023-07-03: 1 via OPHTHALMIC
  Filled 2023-07-03: qty 1

## 2023-07-03 MED ORDER — TETRACAINE HCL 0.5 % OP SOLN
2.0000 [drp] | Freq: Once | OPHTHALMIC | Status: AC
  Administered 2023-07-03: 2 [drp] via OPHTHALMIC
  Filled 2023-07-03: qty 4

## 2023-07-03 MED ORDER — ONDANSETRON 4 MG PO TBDP
ORAL_TABLET | ORAL | Status: AC
  Administered 2023-07-03: 4 mg via ORAL
  Filled 2023-07-03: qty 1

## 2023-07-03 MED ORDER — ONDANSETRON 4 MG PO TBDP: 4.0000 mg | ORAL_TABLET | Freq: Three times a day (TID) | ORAL | 0 refills | Status: DC | PRN

## 2023-07-03 MED ORDER — MORPHINE SULFATE (PF) 4 MG/ML IV SOLN
4.0000 mg | Freq: Once | INTRAVENOUS | Status: AC
  Administered 2023-07-03: 4 mg via INTRAMUSCULAR
  Filled 2023-07-03: qty 1

## 2023-07-03 NOTE — ED Notes (Signed)
Patient transported to CT 
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
## 2023-07-03 NOTE — ED Notes (Signed)
Accidentally dropped first dose of zofran.  Overrided to administer.

## 2023-07-05 ENCOUNTER — Encounter (INDEPENDENT_AMBULATORY_CARE_PROVIDER_SITE_OTHER): Payer: Self-pay

## 2023-07-07 ENCOUNTER — Ambulatory Visit (INDEPENDENT_AMBULATORY_CARE_PROVIDER_SITE_OTHER): Payer: Medicaid Other

## 2023-07-07 ENCOUNTER — Encounter (INDEPENDENT_AMBULATORY_CARE_PROVIDER_SITE_OTHER): Payer: Self-pay

## 2023-07-07 VITALS — Ht 65.0 in | Wt 346.0 lb

## 2023-07-07 DIAGNOSIS — S0231XA Fracture of orbital floor, right side, initial encounter for closed fracture: Secondary | ICD-10-CM | POA: Diagnosis not present

## 2023-07-08 DIAGNOSIS — S0231XA Fracture of orbital floor, right side, initial encounter for closed fracture: Secondary | ICD-10-CM | POA: Insufficient documentation
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
## 2024-04-23 ENCOUNTER — Other Ambulatory Visit: Payer: Self-pay

## 2024-04-23 ENCOUNTER — Inpatient Hospital Stay (HOSPITAL_COMMUNITY)
Admission: AD | Admit: 2024-04-23 | Discharge: 2024-04-23 | Disposition: A | Discharge status: Home / Self Care | Payer: Self-pay | Attending: Obstetrics and Gynecology | Admitting: Obstetrics and Gynecology

## 2024-04-23 DIAGNOSIS — M549 Dorsalgia, unspecified: Secondary | ICD-10-CM | POA: Insufficient documentation

## 2024-04-23 DIAGNOSIS — Z3202 Encounter for pregnancy test, result negative: Secondary | ICD-10-CM

## 2024-04-23 DIAGNOSIS — R109 Unspecified abdominal pain: Secondary | ICD-10-CM | POA: Insufficient documentation

## 2024-04-23 DIAGNOSIS — I160 Hypertensive urgency: Secondary | ICD-10-CM

## 2024-04-23 DIAGNOSIS — N939 Abnormal uterine and vaginal bleeding, unspecified: Secondary | ICD-10-CM | POA: Insufficient documentation

## 2024-04-23 LAB — HCG, QUANTITATIVE, PREGNANCY: hCG, Beta Chain, Quant, S: <1 m[IU]/mL (ref <5)

## 2024-04-23 LAB — POCT PREGNANCY, URINE: Preg Test, Ur: NEGATIVE

## 2024-04-23 NOTE — MAU Note (Signed)
 Sheryl Monroe is a 28 y.o. at Unknown here in MAU reporting: Patient reports abdominal pain and cramping for 1 week. Patient reports pretty large bloods clots yesterday.  LMP: patient unsure of specific date, but sometime in May.  Patient reports positive pregnancy test end of May.   Pain score: back 10/10 abdomen 7/10 Vitals:   04/23/24 1010  BP: (!) 174/116  Pulse: (!) 101  Resp: 16  Temp: 98.9 F (37.2 C)  SpO2: 100%      Lab orders placed from triage:  poct preg

## 2024-04-23 NOTE — MAU Provider Note (Signed)
 History     CSN: [id number]  Arrival date and time: 04/23/24 0941 First Provider Initiated Contact with Patient 04/23/2024 10:12 AM  Chief Complaint  Patient presents with   Abdominal Pain   Back Pain   Vaginal Bleeding    HPI Sheryl Monroe is a 28 y.o. [id number] at Unknown, Not found., who presents to the MAU for VB after UPT+ at home. LMP late April, UPT+ at home in late May. She has been feeling morning sickness and has not been able to get in to establish [facility] yet. For one week, she has been experiencing back pain, cramping, and HA, but she related these pains to moving. Yesterday, she began experiencing VB with clots. Today, it is scant/only with wiping. The bleeding today is not like a period. She has not had any other VB since UPT+. Denies any emergency contraceptive in May.   She reports h/o gHTN in 1 prior pregnancy. She has been told by a doctor that they wanted to watch her bp, but denies any medications for this.   (+) VB, cramping, back pain (-) CP, SOB, blurry vision  Medications Prior to Admission  Medication Sig Dispense Refill Last Dose/Taking   amLODipine  (NORVASC ) 5 MG tablet Take 1 tablet (5 mg total) by mouth daily. (Patient not taking: Reported on 07/07/2023) 30 tablet 0    amoxicillin -clavulanate (AUGMENTIN ) 875-125 MG tablet Take 1 tablet by mouth every 12 (twelve) hours. (Patient not taking: Reported on 07/07/2023) 19 tablet 0    lisdexamfetamine (VYVANSE) 50 MG capsule Take 50 mg by mouth daily. (Patient not taking: Reported on 07/07/2023)      metroNIDAZOLE  (FLAGYL ) 500 MG tablet Take 1 tablet (500 mg total) by mouth 2 (two) times daily. (Patient not taking: Reported on 07/07/2023) 14 tablet 0    ondansetron  (ZOFRAN -ODT) 4 MG disintegrating tablet Take 1 tablet (4 mg total) by mouth every 8 (eight) hours as needed for nausea or vomiting. (Patient not taking: Reported on 07/07/2023) 20 tablet 0    oxyCODONE -acetaminophen  (PERCOCET/ROXICET) 5-325 MG tablet Take 1 tablet  by mouth every 4 (four) hours as needed for severe pain (pain score 7-10). 15 tablet 0    Vitamin D, Ergocalciferol, (DRISDOL) 1.25 MG (50000 UNIT) CAPS capsule Take 50,000 Units by mouth every 7 (seven) days. Wednesday       Past Medical History:  Diagnosis Date   Bacterial vaginosis    Chlamydia    Gestational diabetes    with first pregnancy   Morbid obesity with BMI of 50.0-59.9, adult [facility])     Past Surgical History:  Procedure Laterality Date   CESAREAN SECTION WITH BILATERAL TUBAL LIGATION N/A 09/18/2018   Procedure: CESAREAN SECTION WITH BILATERAL TUBAL LIGATION;  Surgeon: Lake Read, MD;  Location: [facility] ORS;  Service: Obstetrics;  Laterality: N/A;   NO PAST SURGERIES       Allergies: No Known Allergies  ROS reviewed and pertinent positives and negatives as documented in HPI.    Physical Exam  BP (!) 174/116   Pulse (!) 101   Temp 98.9 F (37.2 C)   Resp 16   SpO2 100%   Gen: alert, no acute distress CV: tachycardic Resp: nonlabored  Today's Vitals   04/23/24 1010 04/23/24 1012  BP: (!) 174/116   Pulse: (!) 101   Resp: 16   Temp: 98.9 F (37.2 C)   SpO2: 100%   PainSc:  10-Worst pain ever   There is no height or weight on file to calculate  BMI.     Labs    Results for orders placed or performed during the hospital encounter of 04/23/24 (from the past 24 hours)  Pregnancy, urine POC     Status: None   Collection Time: 04/23/24 10:01 AM  Result Value Ref Range   Preg Test, Ur NEGATIVE NEGATIVE  hCG, quantitative, pregnancy     Status: None   Collection Time: 04/23/24 10:51 AM  Result Value Ref Range   hCG, Beta Chain, Quant, S <1 <5 mIU/mL    Imaging No results found.  MAU Course   Orders Placed This Encounter  Procedures   hCG, quantitative, pregnancy   Pregnancy, urine POC   Discharge patient Discharge disposition: 01-Home or Self Care; Discharge patient date: 04/23/2024    Procedures  Discussed negative Upreg in MAU today.  Quant hCG today <1. Emphasized that she needs to connect with her PCP re: bp control. She is asx today, so no interventions.   Patient has left from triage area before results back and discussed.   Assessment and Plan  MDM [name] is a 28 y.o. [id number] at Unknown, Not found., who presents to the MAU for VB.  Ddx: false positive UPT in May, anovulatory cycle, sAb, polyp/fibroid necrosis.    ICD-10-CM   1. Negative pregnancy test  Z32.02     2. Asymptomatic hypertensive urgency  I16.0        Allergies as of 04/23/2024   No Known Allergies      Medication List     STOP taking these medications    amLODipine  5 MG tablet Commonly known as: NORVASC    amoxicillin -clavulanate 875-125 MG tablet Commonly known as: AUGMENTIN    lisdexamfetamine 50 MG capsule Commonly known as: VYVANSE   metroNIDAZOLE  500 MG tablet Commonly known as: FLAGYL    ondansetron  4 MG disintegrating tablet Commonly known as: ZOFRAN -ODT   oxyCODONE -acetaminophen  5-325 MG tablet Commonly known as: PERCOCET/ROXICET       TAKE these medications    Vitamin D (Ergocalciferol) 1.25 MG (50000 UNIT) Caps capsule Commonly known as: DRISDOL Take 50,000 Units by mouth every 7 (seven) days. Wednesday         Dispo: home in stable condition  Barabara Maier, DO FMOB Fellow, Faculty practice [facility], Center for [employer]

## 2024-05-12 ENCOUNTER — Emergency Department (HOSPITAL_COMMUNITY): Payer: Self-pay

## 2024-05-12 ENCOUNTER — Encounter (HOSPITAL_COMMUNITY): Payer: Self-pay | Admitting: *Deleted

## 2024-05-12 ENCOUNTER — Other Ambulatory Visit: Payer: Self-pay

## 2024-05-12 ENCOUNTER — Emergency Department (HOSPITAL_COMMUNITY)
Admission: EM | Admit: 2024-05-12 | Discharge: 2024-05-12 | Discharge status: Against Medical Advice | Payer: Self-pay | Attending: Emergency Medicine | Admitting: Emergency Medicine

## 2024-05-12 DIAGNOSIS — G43109 Migraine with aura, not intractable, without status migrainosus: Secondary | ICD-10-CM | POA: Diagnosis not present

## 2024-05-12 DIAGNOSIS — R519 Headache, unspecified: Secondary | ICD-10-CM | POA: Insufficient documentation

## 2024-05-12 DIAGNOSIS — R059 Cough, unspecified: Secondary | ICD-10-CM | POA: Diagnosis present

## 2024-05-12 DIAGNOSIS — R531 Weakness: Secondary | ICD-10-CM | POA: Insufficient documentation

## 2024-05-12 DIAGNOSIS — G932 Benign intracranial hypertension: Secondary | ICD-10-CM | POA: Diagnosis not present

## 2024-05-12 DIAGNOSIS — R200 Anesthesia of skin: Secondary | ICD-10-CM | POA: Diagnosis not present

## 2024-05-12 DIAGNOSIS — Z5329 Procedure and treatment not carried out because of patient's decision for other reasons: Secondary | ICD-10-CM | POA: Insufficient documentation

## 2024-05-12 DIAGNOSIS — U071 COVID-19: Secondary | ICD-10-CM | POA: Insufficient documentation

## 2024-05-12 LAB — COMPREHENSIVE METABOLIC PANEL WITH GFR
ALT: 9 U/L (ref 0–44)
AST: 13 U/L — ABNORMAL LOW (ref 15–41)
Albumin: 3 g/dL — ABNORMAL LOW (ref 3.5–5.0)
Alkaline Phosphatase: 67 U/L (ref 38–126)
Anion gap: 12 (ref 5–15)
BUN: 8 mg/dL (ref 6–20)
CO2: 22 mmol/L (ref 22–32)
Calcium: 8.5 mg/dL — ABNORMAL LOW (ref 8.9–10.3)
Chloride: 104 mmol/L (ref 98–111)
Creatinine, Ser: 0.8 mg/dL (ref 0.44–1.00)
GFR, Estimated: >60 mL/min (ref >60)
Glucose, Bld: 108 mg/dL — ABNORMAL HIGH (ref 70–99)
Potassium: 3.7 mmol/L (ref 3.5–5.1)
Sodium: 138 mmol/L (ref 135–145)
Total Bilirubin: 0.4 mg/dL (ref 0.0–1.2)
Total Protein: 7.6 g/dL (ref 6.5–8.1)

## 2024-05-12 LAB — URINALYSIS, ROUTINE W REFLEX MICROSCOPIC
Bilirubin Urine: NEGATIVE
Glucose, UA: NEGATIVE mg/dL
Hgb urine dipstick: NEGATIVE
Ketones, ur: NEGATIVE mg/dL
Nitrite: NEGATIVE
Protein, ur: NEGATIVE mg/dL
Specific Gravity, Urine: 1.008 (ref 1.005–1.030)
pH: 6 (ref 5.0–8.0)

## 2024-05-12 LAB — DIFFERENTIAL
Abs Immature Granulocytes: 0.02 K/uL (ref 0.00–0.07)
Basophils Absolute: 0 K/uL (ref 0.0–0.1)
Basophils Relative: 1 %
Eosinophils Absolute: 0.1 K/uL (ref 0.0–0.5)
Eosinophils Relative: 2 %
Immature Granulocytes: 0 %
Lymphocytes Relative: 24 %
Lymphs Abs: 1.5 K/uL (ref 0.7–4.0)
Monocytes Absolute: 0.6 K/uL (ref 0.1–1.0)
Monocytes Relative: 10 %
Neutro Abs: 4.1 K/uL (ref 1.7–7.7)
Neutrophils Relative %: 63 %

## 2024-05-12 LAB — CBC
HCT: 33.7 % — ABNORMAL LOW (ref 36.0–46.0)
Hemoglobin: 10.5 g/dL — ABNORMAL LOW (ref 12.0–15.0)
MCH: 25.5 pg — ABNORMAL LOW (ref 26.0–34.0)
MCHC: 31.2 g/dL (ref 30.0–36.0)
MCV: 82 fL (ref 80.0–100.0)
Platelets: 402 K/uL — ABNORMAL HIGH (ref 150–400)
RBC: 4.11 MIL/uL (ref 3.87–5.11)
RDW: 15.4 % (ref 11.5–15.5)
WBC: 6.4 K/uL (ref 4.0–10.5)
nRBC: 0 % (ref 0.0–0.2)

## 2024-05-12 LAB — I-STAT CHEM 8, ED
BUN: 7 mg/dL (ref 6–20)
Calcium, Ion: 1.08 mmol/L — ABNORMAL LOW (ref 1.15–1.40)
Chloride: 105 mmol/L (ref 98–111)
Creatinine, Ser: 0.9 mg/dL (ref 0.44–1.00)
Glucose, Bld: 109 mg/dL — ABNORMAL HIGH (ref 70–99)
HCT: 34 % — ABNORMAL LOW (ref 36.0–46.0)
Hemoglobin: 11.6 g/dL — ABNORMAL LOW (ref 12.0–15.0)
Potassium: 3.7 mmol/L (ref 3.5–5.1)
Sodium: 139 mmol/L (ref 135–145)
TCO2: 23 mmol/L (ref 22–32)

## 2024-05-12 LAB — RESP PANEL BY RT-PCR (RSV, FLU A&B, COVID)  RVPGX2
Influenza A by PCR: NEGATIVE
Influenza B by PCR: NEGATIVE
Resp Syncytial Virus by PCR: NEGATIVE
SARS Coronavirus 2 by RT PCR: POSITIVE — AB

## 2024-05-12 LAB — APTT: aPTT: 36 s (ref 24–36)

## 2024-05-12 LAB — CBG MONITORING, ED: Glucose-Capillary: 108 mg/dL — ABNORMAL HIGH (ref 70–99)

## 2024-05-12 LAB — HCG, QUANTITATIVE, PREGNANCY: hCG, Beta Chain, Quant, S: <1 m[IU]/mL (ref <5)

## 2024-05-12 LAB — ETHANOL: Alcohol, Ethyl (B): <15 mg/dL (ref <15)

## 2024-05-12 LAB — HCG, SERUM, QUALITATIVE: Preg, Serum: NEGATIVE

## 2024-05-12 LAB — PROTIME-INR
INR: 1 (ref 0.8–1.2)
Prothrombin Time: 14.3 s (ref 11.4–15.2)

## 2024-05-12 MED ORDER — ACETAZOLAMIDE 250 MG PO TABS
125.0000 mg | ORAL_TABLET | Freq: Once | ORAL | Status: AC
  Administered 2024-05-12: 125 mg via ORAL
  Filled 2024-05-12: qty 1

## 2024-05-12 MED ORDER — ACETAZOLAMIDE 250 MG PO TABS: 250.0000 mg | ORAL_TABLET | Freq: Two times a day (BID) | ORAL | 0 refills | Status: AC

## 2024-05-12 MED ORDER — PROCHLORPERAZINE EDISYLATE 10 MG/2ML IJ SOLN
10.0000 mg | Freq: Once | INTRAMUSCULAR | Status: AC
  Administered 2024-05-12: 10 mg via INTRAVENOUS
  Filled 2024-05-12: qty 2

## 2024-05-12 NOTE — ED Triage Notes (Signed)
 Pt states she has had headache, general body aches, cough & nasal congestion x 2 days. Denies any other s/s at time of assessment.

## 2024-05-12 NOTE — ED Provider Triage Note (Cosign Needed)
 Emergency Medicine Provider Triage Evaluation Note  Sheryl Monroe , a 28 y.o. female  was evaluated in triage.  Pt initially presented complaining of URI symptoms.  Patient reported to nursing staff that around 2:45 AM she had acute onset of left-sided numbness and weakness of her upper and lower extremities.  I was asked come evaluate the patient.  On my evaluation, the patient does have weakness of her left upper and lower extremities.  Patient has 1 out of 5 strength to left upper extremity, 3 out of 5 strength left lower extremity.  She has no slurred speech, no facial droop.  She tracks cross midline.  She is unable to lift her left arm up to evaluate for pronator drift but when I lift her left arm up for her she is able to hold it in a fixed position and has no pronator drift.  She is unable to complete finger-to-nose.  She is alert and oriented.  Code stroke activated at 3 AM.  Review of Systems  Positive:  Negative:   Physical Exam  BP (!) 159/130 (BP Location: Right Arm)   Pulse (!) 104   Temp 97.9 F (36.6 C)   Resp 18   LMP 04/17/2024   SpO2 97%  Gen:   Awake, no distress   Resp:  Normal effort  MSK:   Moves extremities without difficulty  Other:    Medical Decision Making  Medically screening exam initiated at 3:03 AM.  Appropriate orders placed.  Sheryl Monroe was informed that the remainder of the evaluation will be completed by another provider, this initial triage assessment does not replace that evaluation, and the importance of remaining in the ED until their evaluation is complete.     Ruthell Lonni FALCON, PA-C 05/12/24 0304

## 2024-05-12 NOTE — ED Notes (Signed)
 Upon standing, difficulty noted moving left leg

## 2024-05-12 NOTE — Discharge Instructions (Signed)
 You can take acetazolamide  2 times per day.  Return to the emergency room for worsening issues with your left side.  Follow-up with your primary care doctor.

## 2024-05-12 NOTE — Consult Note (Signed)
 NEUROLOGY CONSULT NOTE   Date of service: May 12, 2024 Patient Name: Sheryl Monroe MRN:  [id number] DOB:  [birth year] Chief Complaint: Severe headache and left sided numbness with weakness Requesting Provider: Lorette Mayo, MD  History of Present Illness  Sheryl Monroe is a 28 y.o. female with a PMHx of headache and morbid obesity who presented with chest pain, general body aches, cough and nasal congestion. While in Triage, she had sudden onset of left sided numbness and a Code Stroke was called.   In CT, the patient states that her headache is 10/10, throbbing and holocephalic, worse to her forehead region. She is experiencing stars in her vision and mild photophobia, but no nausea. She states that she has had similar headaches in the past, but not frequently. She endorses blurred vision as well. She states that she was previously seen about 4 years ago for a similar headache and had an LP at that time. She does not remember if she was told whether or not her CSF pressure was elevated, but states that her headache resolved shortly after the LP. She also endorses complete sensory numbness to her left face, arm and leg, along with weakness of her arm and leg.   Chart review reveals that the opening pressure of her LP in the ED on 01/03/21 was elevated at 35, consistent with IIH. Recommendations at that time included starting the patient on Diamox  and close follow-up with ophthalmology as well as neurology. Importance of dietary modification and referral to a bariatric surgical center for assistance in weight management were also discussed with the patient. Possible complication of permanent vision loss was also discussed with the patient at that time.   LKW: 0245 Modified rankin score: 0 IV Thrombolysis:  No: Presentation is most consistent with complicated migraine. Risks/benefits of possible treatment with TNK were discussed in the context of what is felt to be a low probability of stroke  given her clinical features, and the patient declined TNK.  (reason) EVT:  No: Presentation is not consistent with LVO.    NIHSS components Score: Comment  1a Level of Conscious 0[x]  1[]  2[]  3[]      1b LOC Questions 0[x]  1[]  2[]       1c LOC Commands 0[x]  1[]  2[]       2 Best Gaze 0[x]  1[]  2[]       3 Visual 0[x]  1[]  2[]  3[]      4 Facial Palsy 0[x]  1[]  2[]  3[]      5a Motor Arm - left 0[x]  1[]  2[]  3[]  4[]  UN[]   Giveway weakness with significant variability between trials.   5b Motor Arm - Right 0[x]  1[]  2[]  3[]  4[]  UN[]    6a Motor Leg - Left 0[]  1[x]  2[]  3[]  4[]  UN[]   Giveway weakness with significant variability between trials  6b Motor Leg - Right 0[x]  1[]  2[]  3[]  4[]  UN[]    7 Limb Ataxia 0[x]  1[]  2[]  UN[]      8 Sensory 0[]  1[]  2[x]  UN[]     Subjectively completely insensate to touch and pressure applied to face, arm and leg.   9 Best Language 0[x]  1[]  2[]  3[]      10 Dysarthria 0[x]  1[]  2[]  UN[]      11 Extinct. and Inattention 0[x]  1[]  2[]       TOTAL:   3      ROS  Comprehensive ROS performed and pertinent positives documented in HPI    Past History   Past Medical History:  Diagnosis Date   Bacterial vaginosis  Chlamydia    Gestational diabetes    with first pregnancy   Morbid obesity with BMI of 50.0-59.9, adult [facility])     Past Surgical History:  Procedure Laterality Date   CESAREAN SECTION WITH BILATERAL TUBAL LIGATION N/A 09/18/2018   Procedure: CESAREAN SECTION WITH BILATERAL TUBAL LIGATION;  Surgeon: Lake Read, MD;  Location: [facility] ORS;  Service: Obstetrics;  Laterality: N/A;   NO PAST SURGERIES      Family History: Family History  Problem Relation Age of Onset   Hypertension Father    Diabetes Paternal Grandmother    Hypertension Paternal Grandmother    Hypertension Maternal Grandmother    Breast cancer Maternal Grandmother    Hypertension Paternal Grandfather    Diabetes Paternal Grandfather    Hypothyroidism Maternal Aunt     Social History   reports that she has never smoked. She has never used smokeless tobacco. She reports that she does not drink alcohol and does not use drugs.  No Known Allergies  Medications  No current facility-administered medications for this encounter.  Current Outpatient Medications:    Vitamin D, Ergocalciferol, (DRISDOL) 1.25 MG (50000 UNIT) CAPS capsule, Take 50,000 Units by mouth every 7 (seven) days. Wednesday, Disp: , Rfl:   Vitals   Vitals:   05/12/24 0223 05/12/24 0242  BP: (!) 162/130 (!) 159/130  Pulse: (!) 104   Resp: 18   Temp: 97.9 F (36.6 C)   SpO2: 97%     There is no height or weight on file to calculate BMI.   Physical Exam   Constitutional: Appears well-developed. She is morbidly obese.  Psych: Anxious affect. Eyes: No scleral injection.  HENT: No OP obstruction.  Head: Normocephalic.  Respiratory: Effort normal, non-labored breathing.    Neurologic Examination   See NIHSS  Labs/Imaging/Neurodiagnostic studies   CBC: No results for input(s): WBC, NEUTROABS, HGB, HCT, MCV, PLT in the last 168 hours. Basic Metabolic Panel:  Lab Results  Component Value Date   NA 140 01/03/2021   K 3.5 01/03/2021   CO2 23 01/03/2021   GLUCOSE 105 (H) 01/03/2021   BUN 11 01/03/2021   CREATININE 0.63 01/03/2021   CALCIUM 9.3 01/03/2021   GFRNONAA >60 01/03/2021   GFRAA >60 10/20/2019   Lipid Panel: No results found for: LDLCALC HgbA1c: No results found for: HGBA1C Urine Drug Screen:     Component Value Date/Time   LABOPIA NONE DETECTED 10/20/2019 1333   COCAINSCRNUR NONE DETECTED 10/20/2019 1333   COCAINSCRNUR NONE DETECTED 09/17/2018 0942   LABBENZ NONE DETECTED 10/20/2019 1333   AMPHETMU NONE DETECTED 10/20/2019 1333   THCU POSITIVE (A) 10/20/2019 1333   LABBARB NONE DETECTED 10/20/2019 1333    Alcohol Level     Component Value Date/Time   ETH <10 10/20/2019 1630   INR  Lab Results  Component Value Date   INR 1.0 01/03/2021   APTT  Lab  Results  Component Value Date   APTT 28 12/26/2020     ASSESSMENT  Sheryl Monroe is a 28 y.o. female presenting with severe 10/10 throbbing holocephalic headache, visual scintillating scotoma and photophobia, with acute onset of left sided weakness and numbness while in Triage.  - Exam reveals left sided numbness and variable left arm and leg weakness across several trials. No facial droop, visual field cut or dysarthria noted.  - CT head:  No acute intracranial abnormality. ASPECTS is 10. Partially empty sella. - Impression: Presentation is most consistent with a complicated migraine. She most likely also  has underlying pseudotumor cerebri given LP in 2022 with opening pressure of 35. Although stroke is felt to be unlikely, an MRI should be obtained to fully rule this out.   RECOMMENDATIONS  - MRI brain - Restart Diamox  - She should follow up with Neurology outpatient. Also should be seen by Ophthalmology for [facility] perimetry and dilated retinal exam.  - Discussed the need for weight loss and PCP follow up for possible trial of a GLP-1 agonist such as Ozempic. The patient expressed understanding and agreement with this recommendation.  - Further recommendations following MRI  Addendum: - MRI brain: No acute intracranial abnormality. Stable since 2022 noncontrast MRI appearance of the brain with mildly age advanced nonspecific cerebral white matter signal changes. - Unfortunately, the patient decided to leave AMA after her MRI. If she returns to the ED, she may benefit from repeat LP. Outpatient Neurology and Ophthalmology follow up and referral to a bariatric weight loss clinic should also be pursued. ______________________________________________________________________    Bonney SHARK, Mahitha Hickling, MD [facility]  Neurohospitalist

## 2024-05-12 NOTE — ED Provider Notes (Signed)
 [place] EMERGENCY DEPARTMENT AT [facility] Provider Note   CSN: [id number] Arrival date & time: 05/12/24  9781  An emergency department physician performed an initial assessment on this suspected stroke patient at [address] PA).  Patient presents with: URI and Code Stroke   Sheryl Monroe is a 28 y.o. female.   Patient has been sick since yesterday morning with headache, runny nose, cough and other associated symptoms.  She states prior to arrival she had acute onset of left-sided weakness and numbness.  She is never anything like this before.  She also has a bit of a headache.  No trauma.  No fevers.  No other associated symptoms.   URI      Prior to Admission medications   Medication Sig Start Date End Date Taking? Authorizing Provider  Vitamin D, Ergocalciferol, (DRISDOL) 1.25 MG (50000 UNIT) CAPS capsule Take 50,000 Units by mouth every 7 (seven) days. Wednesday    [provider]  Cetirizine  HCl 10 MG CAPS Take 1 capsule (10 mg total) by mouth daily. Patient not taking: No sig reported 12/08/20 12/16/20  Wieters, Hallie C, PA-C    Allergies: Patient has no known allergies.    Review of Systems  Updated Vital Signs BP 134/89   Pulse (!) 109   Temp 98 F (36.7 C) (Oral)   Resp 12   LMP 04/17/2024   SpO2 100%   Physical Exam Vitals and nursing note reviewed.  Constitutional:      Appearance: She is well-developed.  HENT:     Head: Normocephalic and atraumatic.  Cardiovascular:     Rate and Rhythm: Normal rate and regular rhythm.  Pulmonary:     Effort: No respiratory distress.     Breath sounds: No stridor.  Abdominal:     General: There is no distension.  Musculoskeletal:     Cervical back: Normal range of motion.  Neurological:     Mental Status: She is alert.     Comments: No cranial nerve abnormalities.  She has diminished strength in her left upper and lower extremity compared to the right.     (all labs ordered are listed,  but only abnormal results are displayed) Labs Reviewed  RESP PANEL BY RT-PCR (RSV, FLU A&B, COVID)  RVPGX2 - Abnormal; Notable for the following components:      Result Value   SARS Coronavirus 2 by RT PCR POSITIVE (*)    All other components within normal limits  COMPREHENSIVE METABOLIC PANEL WITH GFR - Abnormal; Notable for the following components:   Glucose, Bld 108 (*)    Calcium 8.5 (*)    Albumin 3.0 (*)    AST 13 (*)    All other components within normal limits  CBC - Abnormal; Notable for the following components:   Hemoglobin 10.5 (*)    HCT 33.7 (*)    MCH 25.5 (*)    Platelets 402 (*)    All other components within normal limits  URINALYSIS, ROUTINE W REFLEX MICROSCOPIC - Abnormal; Notable for the following components:   APPearance HAZY (*)    Leukocytes,Ua LARGE (*)    Bacteria, UA RARE (*)    All other components within normal limits  I-STAT CHEM 8, ED - Abnormal; Notable for the following components:   Glucose, Bld 109 (*)    Calcium, Ion 1.08 (*)    Hemoglobin 11.6 (*)    HCT 34.0 (*)    All other components within normal limits  CBG  MONITORING, ED - Abnormal; Notable for the following components:   Glucose-Capillary 108 (*)    All other components within normal limits  HCG, SERUM, QUALITATIVE  HCG, QUANTITATIVE, PREGNANCY  ETHANOL  PROTIME-INR  APTT  DIFFERENTIAL  RAPID URINE DRUG SCREEN, HOSP PERFORMED    EKG: None  Radiology: CT HEAD CODE STROKE WO CONTRAST Result Date: 05/12/2024 CLINICAL DATA:  Code stroke. Initial evaluation for acute neuro deficit, stroke. EXAM: CT HEAD WITHOUT CONTRAST TECHNIQUE: Contiguous axial images were obtained from the base of the skull through the vertex without intravenous contrast. RADIATION DOSE REDUCTION: This exam was performed according to the departmental dose-optimization program which includes automated exposure control, adjustment of the mA and/or kV according to patient size and/or use of iterative  reconstruction technique. COMPARISON:  Comparison made with prior CT from 07/03/2023. FINDINGS: Brain: Cerebral volume within normal limits. No acute intracranial hemorrhage. No acute large vessel territory infarct. No mass lesion, midline shift or mass effect. No hydrocephalus or extra-axial fluid collection. Partially empty sella noted. Vascular: No abnormal hyperdense vessel. Skull: Scalp soft tissues demonstrate no acute finding. Calvarium intact. Sinuses/Orbits: Globes orbital soft tissues demonstrate no acute finding. Remote right orbital floor fracture noted. Paranasal sinuses are largely clear. No mastoid effusion. Other: None. ASPECTS [facility] Stroke Program Early CT Score) - Ganglionic level infarction (caudate, lentiform nuclei, internal capsule, insula, M1-M3 cortex): 7 - Supraganglionic infarction (M4-M6 cortex): 3 Total score (0-10 with 10 being normal): 10 IMPRESSION: 1. No acute intracranial abnormality. 2. ASPECTS is 10. 3. Partially empty sella. These results were communicated to Dr. Lindzen at 3:26 am on 05/12/2024 by text page via the [facility] messaging system. Electronically Signed   By: Morene Hoard M.D.   On: 05/12/2024 03:26     Procedures   Medications Ordered in the ED  acetaZOLAMIDE  (DIAMOX ) tablet 125 mg (has no administration in time range)  prochlorperazine  (COMPAZINE ) injection 10 mg (10 mg Intravenous Given 05/12/24 0553)                                    Medical Decision Making Amount and/or Complexity of Data Reviewed Labs: ordered. Radiology: ordered.  Risk Prescription drug management.   Per neurology possible complex migraine versus complication from IIH.  She was diagnosed with IIH was ago with opening pressure of 35.  No indication for LP now.  Will give Diamox , Compazine  and get MRI to rule out intracranial causes.  If improved can go home if she has persistent symptoms may need to be admitted.  Also has COVID.  No respiratory failure.  No other  associated symptoms with the COVID.  But could be causing the headache which could be causing other symptoms.  Care transferred pending completion of workup, reevaluation and ultimate disposition.       Mattheus Rauls, Selinda, MD 05/12/24 [phone]

## 2024-05-12 NOTE — ED Notes (Signed)
 Pt in MRI.

## 2024-05-12 NOTE — ED Notes (Signed)
 Pt returned from MRI

## 2024-05-12 NOTE — ED Notes (Addendum)
 Pt called RN back to room after dropping her stickers. Reports left sided numbness that started about 5 mins prior to me being in the room. [place] PA notified to see patient. Sensory deficit noted to left arm and leg, weaker grip to left hand , no drift to left arm

## 2024-05-12 NOTE — ED Triage Notes (Signed)
 Patient reports not feeling well, onset tonight, with headache, weakness, and generally not feeling well

## 2024-05-12 NOTE — ED Notes (Signed)
 Discusses risks of leaving AMA to pt, pt stated that she understood and would return if she felt that she was getting worse. Pt alert, oriented, and ambulatory.

## 2024-05-12 NOTE — ED Provider Notes (Addendum)
  Physical Exam  BP 134/89   Pulse (!) 109   Temp 98 F (36.7 C) (Oral)   Resp 12   LMP 04/17/2024   SpO2 100%   Physical Exam  Procedures  Procedures  ED Course / MDM    Medical Decision Making Amount and/or Complexity of Data Reviewed Labs: ordered. Radiology: ordered.  Risk Prescription drug management. Decision regarding hospitalization.   LILLETTE Fairy Gravely, assumed care for this patient.  In brief this is a 28 year old female who came for URI symptoms, developed some numbness, weakness on her left side.  Patient was signed out pending MRI.  Patient has a past history of IIH.  Symptoms may be related to that.  Reassessed the patient, she has regained sensation in her left side, however continues to have weakness on that left side.  Symptoms did not immediately resolve with treating migraine headache.  MRI negative.  BMI precludes bedside lumbar puncture.  Patient requires further workup for her left-sided weakness which may include EEG and lumbar puncture.  She has received Diamox .  Reassessment 8:05 AM-received a message from nursing staff that the patient wanted to speak with me about not remaining in the hospital.  I went in and talked with the patient.  She tells me that she has nobody at home to take care of her children so she cannot be admitted.  I discussed with the patient my concern about the weakness that she is having on the left side, and how this required further workup.  We had a lengthy discussion about possible causes, and the risk for worsening deficits if not appropriately treated.  Patient understood these risks, however she has to go home and take care of her children.  I am certainly sympathetic to this reasoning.  I have sent a prescription for her Diamox  to the pharmacy.  I did explain to the patient that this would be AGAINST MEDICAL ADVICE.  Patient understood this.       Gravely Fairy T, DO 05/12/24 0748    Gravely Fairy T, DO 05/12/24  [phone]
